# Patient Record
Sex: Female | Born: 1945 | Race: White | Hispanic: No | Smoking: Never smoker
Health system: Southern US, Community
[De-identification: ages and names within clinical notes are randomized; demographics above are authoritative.]

## PROBLEM LIST (undated history)

## (undated) DIAGNOSIS — R011 Cardiac murmur, unspecified: Secondary | ICD-10-CM

## (undated) DIAGNOSIS — I10 Essential (primary) hypertension: Secondary | ICD-10-CM

## (undated) DIAGNOSIS — E119 Type 2 diabetes mellitus without complications: Secondary | ICD-10-CM

## (undated) DIAGNOSIS — B019 Varicella without complication: Secondary | ICD-10-CM

## (undated) DIAGNOSIS — M1990 Unspecified osteoarthritis, unspecified site: Secondary | ICD-10-CM

## (undated) DIAGNOSIS — E7800 Pure hypercholesterolemia, unspecified: Secondary | ICD-10-CM

## (undated) DIAGNOSIS — H547 Unspecified visual loss: Secondary | ICD-10-CM

## (undated) DIAGNOSIS — H409 Unspecified glaucoma: Secondary | ICD-10-CM

## (undated) DIAGNOSIS — K635 Polyp of colon: Secondary | ICD-10-CM

## (undated) HISTORY — DX: Polyp of colon: K63.5

## (undated) HISTORY — DX: Varicella without complication: B01.9

## (undated) HISTORY — DX: Unspecified glaucoma: H40.9

## (undated) HISTORY — PX: CATARACT EXTRACTION: SUR2

## (undated) HISTORY — DX: Unspecified osteoarthritis, unspecified site: M19.90

## (undated) HISTORY — DX: Cardiac murmur, unspecified: R01.1

---
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
## 2014-08-16 DIAGNOSIS — H44512 Absolute glaucoma, left eye: Secondary | ICD-10-CM | POA: Insufficient documentation

## 2015-03-31 ENCOUNTER — Emergency Department (HOSPITAL_BASED_OUTPATIENT_CLINIC_OR_DEPARTMENT_OTHER): Payer: Medicare Other

## 2015-03-31 ENCOUNTER — Emergency Department (HOSPITAL_BASED_OUTPATIENT_CLINIC_OR_DEPARTMENT_OTHER)
Admission: EM | Admit: 2015-03-31 | Discharge: 2015-04-01 | Disposition: A | Discharge status: Home / Self Care | Payer: Medicare Other | Attending: Emergency Medicine | Admitting: Emergency Medicine

## 2015-03-31 ENCOUNTER — Encounter (HOSPITAL_BASED_OUTPATIENT_CLINIC_OR_DEPARTMENT_OTHER): Payer: Self-pay | Admitting: *Deleted

## 2015-03-31 DIAGNOSIS — E119 Type 2 diabetes mellitus without complications: Secondary | ICD-10-CM | POA: Insufficient documentation

## 2015-03-31 DIAGNOSIS — E780 Pure hypercholesterolemia: Secondary | ICD-10-CM | POA: Diagnosis not present

## 2015-03-31 DIAGNOSIS — I10 Essential (primary) hypertension: Secondary | ICD-10-CM | POA: Diagnosis not present

## 2015-03-31 DIAGNOSIS — R51 Headache: Secondary | ICD-10-CM | POA: Diagnosis not present

## 2015-03-31 DIAGNOSIS — R519 Headache, unspecified: Secondary | ICD-10-CM

## 2015-03-31 DIAGNOSIS — Z79899 Other long term (current) drug therapy: Secondary | ICD-10-CM | POA: Diagnosis not present

## 2015-03-31 DIAGNOSIS — Z7982 Long term (current) use of aspirin: Secondary | ICD-10-CM | POA: Insufficient documentation

## 2015-03-31 HISTORY — DX: Pure hypercholesterolemia, unspecified: E78.00

## 2015-03-31 HISTORY — DX: Essential (primary) hypertension: I10

## 2015-03-31 HISTORY — DX: Type 2 diabetes mellitus without complications: E11.9

## 2015-03-31 LAB — CBC WITH DIFFERENTIAL/PLATELET
Basophils Absolute: 0 10*3/uL (ref 0.0–0.1)
Basophils Relative: 0 % (ref 0–1)
Eosinophils Absolute: 0.2 10*3/uL (ref 0.0–0.7)
Eosinophils Relative: 3 % (ref 0–5)
HCT: 42.1 % (ref 36.0–46.0)
Hemoglobin: 13.7 g/dL (ref 12.0–15.0)
Lymphocytes Relative: 25 % (ref 12–46)
Lymphs Abs: 2 10*3/uL (ref 0.7–4.0)
MCH: 28.1 pg (ref 26.0–34.0)
MCHC: 32.5 g/dL (ref 30.0–36.0)
MCV: 86.3 fL (ref 78.0–100.0)
Monocytes Absolute: 1 10*3/uL (ref 0.1–1.0)
Monocytes Relative: 12 % (ref 3–12)
Neutro Abs: 4.8 10*3/uL (ref 1.7–7.7)
Neutrophils Relative %: 60 % (ref 43–77)
Platelets: 254 10*3/uL (ref 150–400)
RBC: 4.88 MIL/uL (ref 3.87–5.11)
RDW: 14.6 % (ref 11.5–15.5)
WBC: 8.1 10*3/uL (ref 4.0–10.5)

## 2015-03-31 LAB — BASIC METABOLIC PANEL
Anion gap: 12 (ref 5–15)
BUN: 15 mg/dL (ref 6–20)
CO2: 29 mmol/L (ref 22–32)
Calcium: 10.1 mg/dL (ref 8.9–10.3)
Chloride: 99 mmol/L — ABNORMAL LOW (ref 101–111)
Creatinine, Ser: 0.81 mg/dL (ref 0.44–1.00)
GFR calc Af Amer: >60 mL/min (ref >60)
GFR calc non Af Amer: >60 mL/min (ref >60)
Glucose, Bld: 112 mg/dL — ABNORMAL HIGH (ref 65–99)
Potassium: 3.6 mmol/L (ref 3.5–5.1)
Sodium: 140 mmol/L (ref 135–145)

## 2015-03-31 MED ORDER — KETOROLAC TROMETHAMINE 15 MG/ML IJ SOLN
15.0000 mg | Freq: Once | INTRAMUSCULAR | Status: AC
  Administered 2015-03-31: 15 mg via INTRAVENOUS
  Filled 2015-03-31: qty 1

## 2015-03-31 NOTE — ED Notes (Signed)
PA at bedside.

## 2015-03-31 NOTE — ED Notes (Signed)
Patient transported to CT via stretcher per tech. 

## 2015-03-31 NOTE — ED Notes (Signed)
Pt. Reports she has had a headache since yesterday on the crown of her head and is reporting she has been dizzy.  Pt. States she has had low and high B/P on her monitor at her home.

## 2015-04-01 NOTE — Discharge Instructions (Signed)
Please monitor for new or worsening signs or symptoms, any present please return immediately for further evaluation. Please contact her primary care provider and schedule immediate follow-up evaluation. Please use Tylenol as needed for headache.

## 2015-04-01 NOTE — ED Provider Notes (Signed)
CSN: [id number]     Arrival date & time 03/31/15  2211 History   First MD Initiated Contact with Patient 03/31/15 2233     Chief Complaint  Patient presents with  . Headache   HPI  69 year old female presents today with a headache. Patient reports for the last 2 days she's had a headache on the top of her head, no radiation of symptoms. Patient denies any significant past medical history of headaches, she denies any focal neurological deficits, fever, no photophobia, phonophobia, head trauma, or any other red flags. Patient notes that recently she's been more unsteady on her feet, normally walks with the walker and the assistance of her son, but has had an increase over this baseline. Patient reports that she has hypertension and had an elevated reading today in the 140s, she also had a reading in the 110s that concerned her. She has she takes her blood pressure medication every day, her diabetes as well managed.    Past Medical History  Diagnosis Date  . Hypertension   . Diabetes mellitus without complication   . Hypercholesterolemia    Past Surgical History  Procedure Laterality Date  . Cesarean section     No family history on file. Social History  Substance Use Topics  . Smoking status: Never Smoker   . Smokeless tobacco: None  . Alcohol Use: No   OB History    No data available     Review of Systems  All other systems reviewed and are negative.   Allergies  Review of patient's allergies indicates no known allergies.  Home Medications   Prior to Admission medications   Medication Sig Start Date End Date Taking? Authorizing Provider  amLODipine (NORVASC) 10 MG tablet Take 10 mg by mouth daily.   Yes Historical Provider, MD  aspirin 81 MG chewable tablet Chew by mouth daily.   Yes Historical Provider, MD  atorvastatin (LIPITOR) 10 MG tablet Take 10 mg by mouth daily.   Yes Historical Provider, MD  hydrochlorothiazide (HYDRODIURIL) 25 MG tablet Take 25 mg by mouth daily.    Yes Historical Provider, MD  metoprolol (TOPROL-XL) 200 MG 24 hr tablet Take 200 mg by mouth daily.   Yes Historical Provider, MD  pioglitazone (ACTOS) 30 MG tablet Take 30 mg by mouth daily.   Yes Historical Provider, MD  potassium chloride (KLOR-CON) 20 MEQ packet Take by mouth 2 (two) times daily.   Yes Historical Provider, MD  sitaGLIPtin-metformin (JANUMET) 50-1000 MG per tablet Take 1 tablet by mouth 2 (two) times daily with a meal.   Yes Historical Provider, MD    BP 152/82 mmHg  Pulse 65  Temp(Src) 98.1 F (36.7 C) (Oral)  Resp 18  Ht 1' (0.305 m)  Wt 159 lb (72.122 kg)  BMI 775.30 kg/m2  SpO2 97%   Physical Exam  Constitutional: She is oriented to person, place, and time. She appears well-developed and well-nourished.  HENT:  Head: Normocephalic and atraumatic.  Eyes: Conjunctivae are normal. Pupils are equal, round, and reactive to light. Right eye exhibits no discharge. Left eye exhibits no discharge. No scleral icterus.  Neck: Normal range of motion. No JVD present. No tracheal deviation present.  Cardiovascular: Normal rate, regular rhythm, normal heart sounds and intact distal pulses.  Exam reveals no gallop and no friction rub.   No murmur heard. Pulmonary/Chest: Effort normal. No stridor.  Abdominal: Soft. She exhibits no mass. There is no tenderness. There is no rebound and no guarding.  Musculoskeletal:  Normal range of motion. She exhibits no edema or tenderness.  Neurological: She is alert and oriented to person, place, and time. She has normal strength. No cranial nerve deficit or sensory deficit. Coordination normal. GCS eye subscore is 4. GCS verbal subscore is 5. GCS motor subscore is 6.  Skin: Skin is warm and dry.  Psychiatric: She has a normal mood and affect. Her behavior is normal. Judgment and thought content normal.  Nursing note and vitals reviewed.   ED Course  Procedures (including critical care time) Labs Review Labs Reviewed  BASIC METABOLIC  PANEL - Abnormal; Notable for the following:    Chloride 99 (*)    Glucose, Bld 112 (*)    All other components within normal limits  CBC WITH DIFFERENTIAL/PLATELET    Imaging Review Ct Head Wo Contrast  03/31/2015   CLINICAL DATA:  Headache, dizziness, unsteady gait  EXAM: CT HEAD WITHOUT CONTRAST  TECHNIQUE: Contiguous axial images were obtained from the base of the skull through the vertex without intravenous contrast.  COMPARISON:  08/30/2013  FINDINGS: Mild cortical volume loss noted with proportional ventricular prominence. Areas of periventricular white matter hypodensity are most compatible with small vessel ischemic change. No acute hemorrhage, infarct, or mass lesion is identified.  IMPRESSION: Chronic findings as above, no acute intracranial abnormality.   Electronically Signed   By: Christiana Pellant M.D.   On: 03/31/2015 23:45   I have personally reviewed and evaluated these images and lab results as part of my medical decision-making.   EKG Interpretation None      MDM   Final diagnoses:  Nonintractable headache, unspecified chronicity pattern, unspecified headache type    Labs: CBC, BMP- no significant findings  Imaging: CT head without contrast- no significant findings  Consults:  Therapeutics: Toradol  Discharge Meds:   Assessment/Plan: Patient presents with a headache, no significant past medical history of headaches. She reports some "unsteadiness" of her gait. Speaking with her son he notes she is always unsteady but admits it may be more pronounced. She has history of hypertension diabetes, CT scan showed no significant findings. Neurological exam normal. Patient has close follow-up with her primary care, she'll be encouraged to call him tomorrow morning schedule follow-up evaluation. She's given strict return precautions, verbalized understanding and agreement with a splint with no further questions or concerns at the time of discharge.        Eyvonne Mechanic, PA-C 04/01/15 1191  Pricilla Loveless, MD 04/05/15 [phone]

## 2016-04-04 IMAGING — CT CT HEAD W/O CM
1 series · 16 of 30 positions shown, 20 images · non-contrast
Comparison: 08/30/2013

CLINICAL DATA: Headache, dizziness, unsteady gait

EXAM:
CT HEAD WITHOUT CONTRAST
TECHNIQUE: Contiguous axial images were obtained from the base of the skull
through the vertex without intravenous contrast.

[Series 2: head 4.8 h37s · axial · 0.40mm/px · z∈[+1141,+1277]mm · 16 of 32 slices shown, 20 images]
[im 2/32  brain]
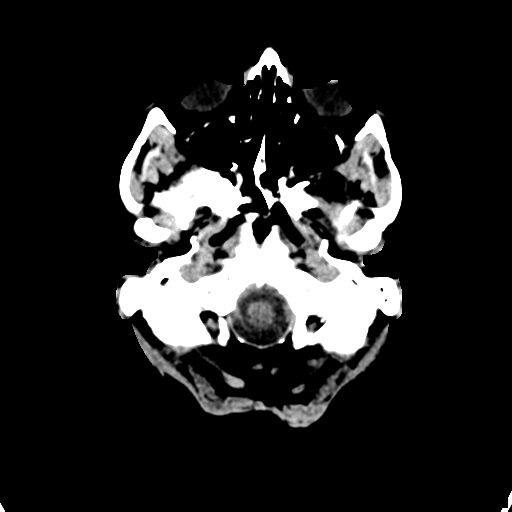
[im 2/32  bone]
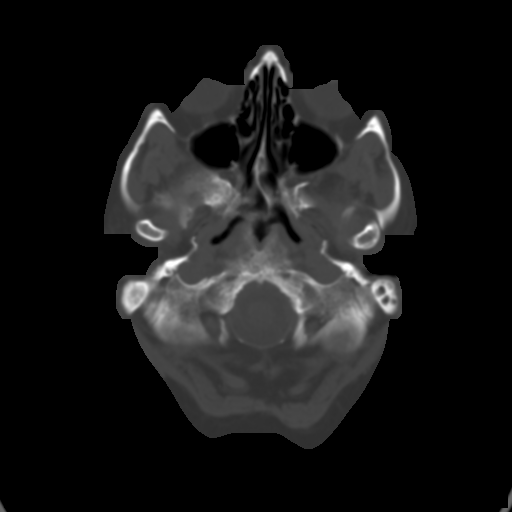
[im 4/32  brain]
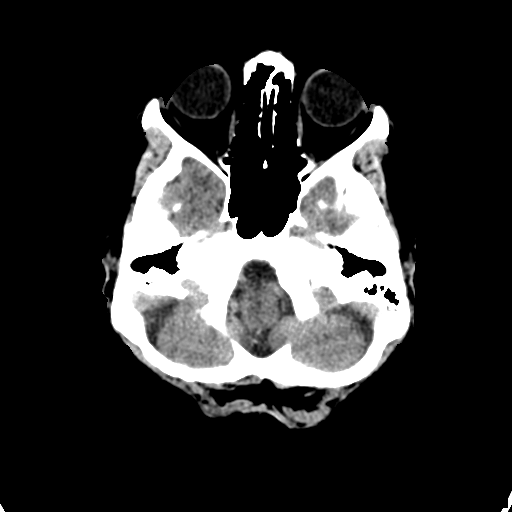
[im 6/32  brain]
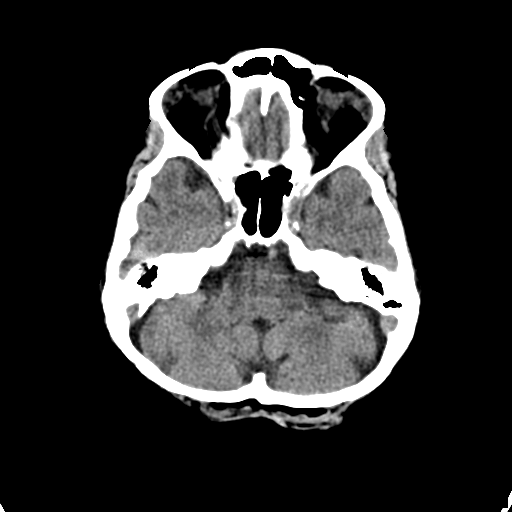
[im 8/32  brain]
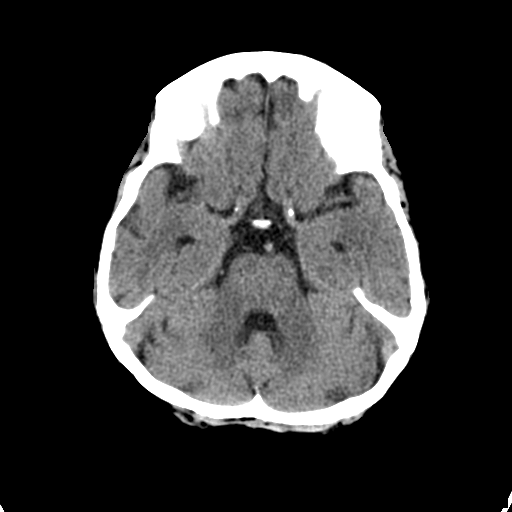
[im 9/32  brain]
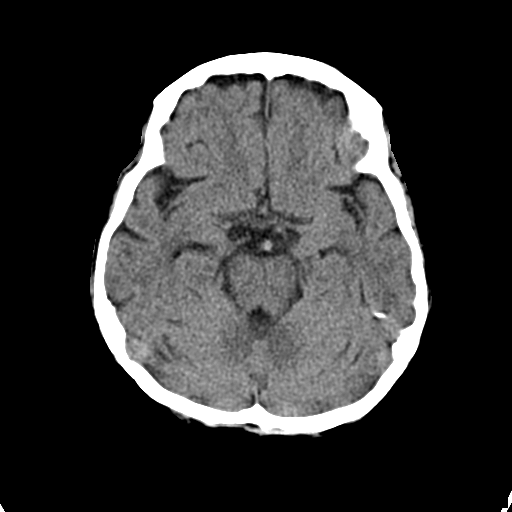
[im 9/32  bone]
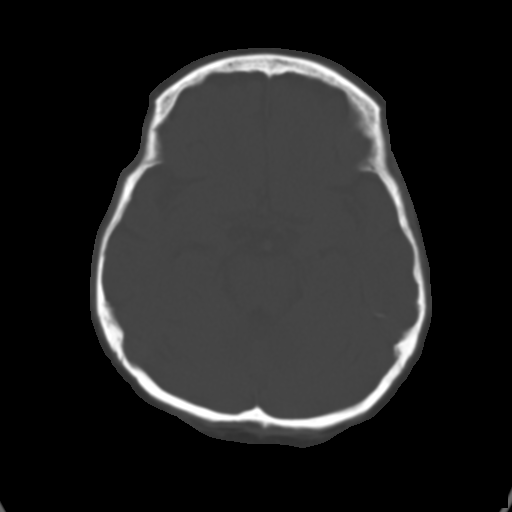
[im 11/32  brain]
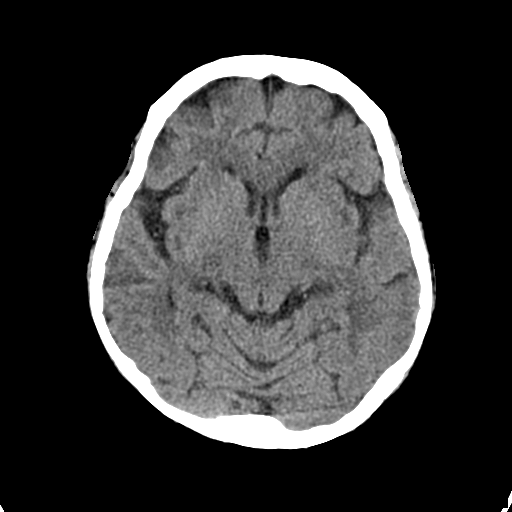
[im 13/32  brain]
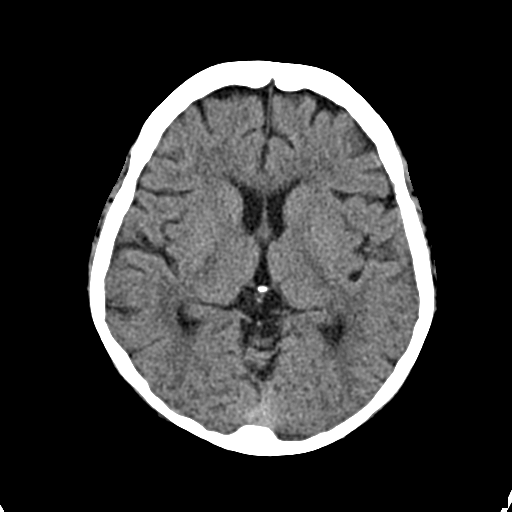
[im 15/32  brain]
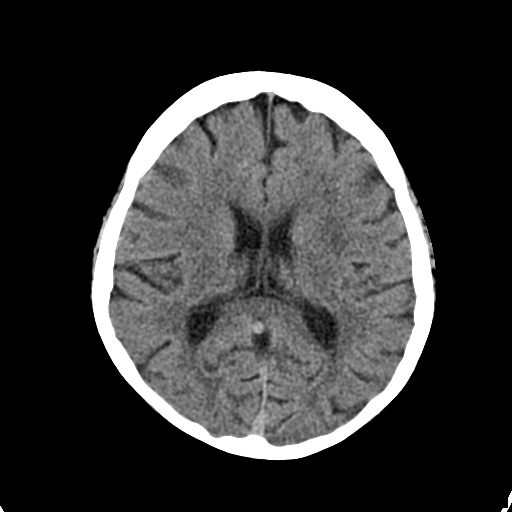
[im 17/32  brain]
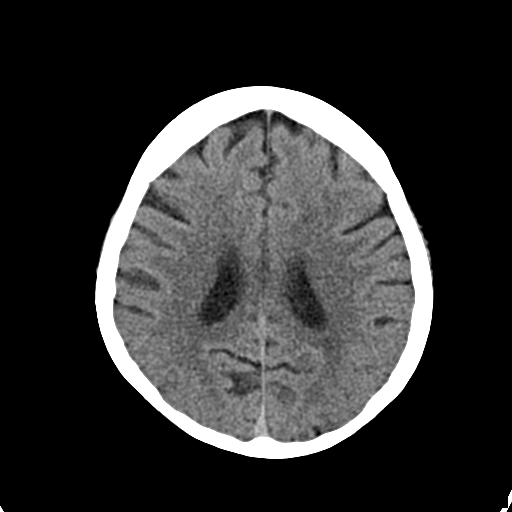
[im 17/32  bone]
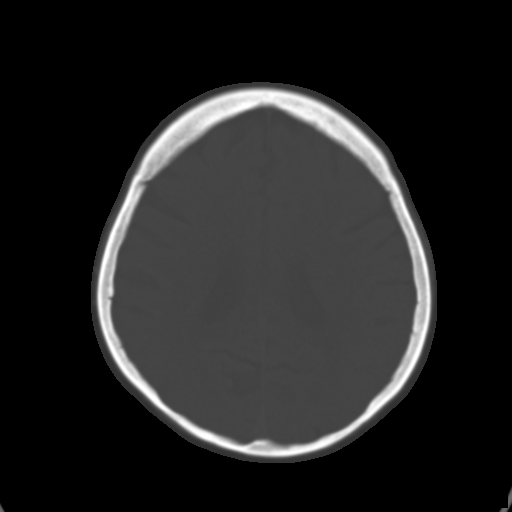
[im 19/32  brain]
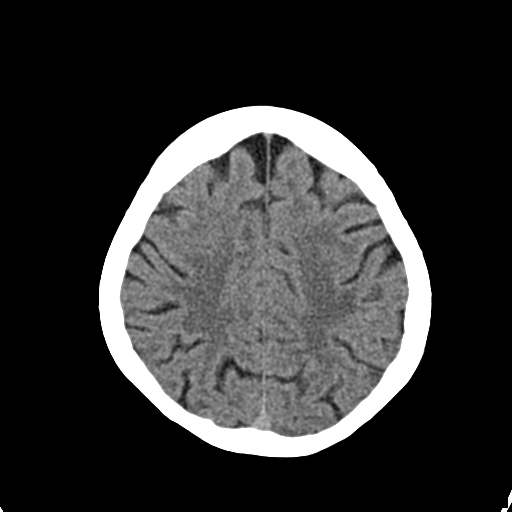
[im 21/32  brain]
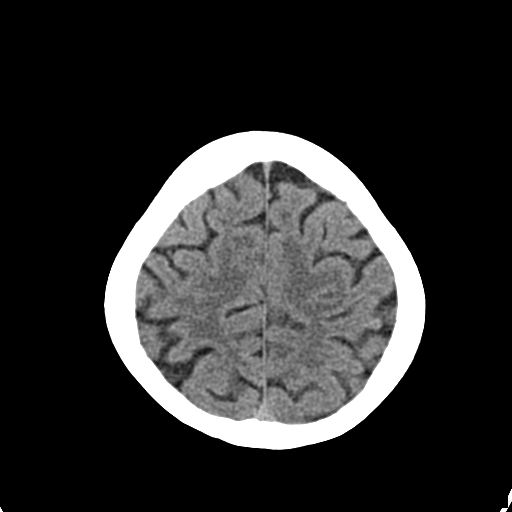
[im 23/32  brain]
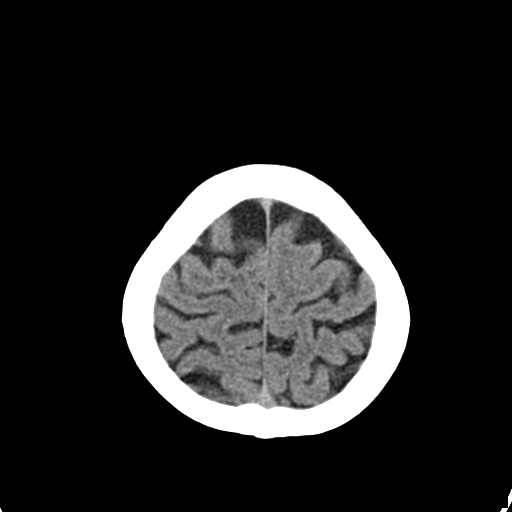
[im 24/32  brain]
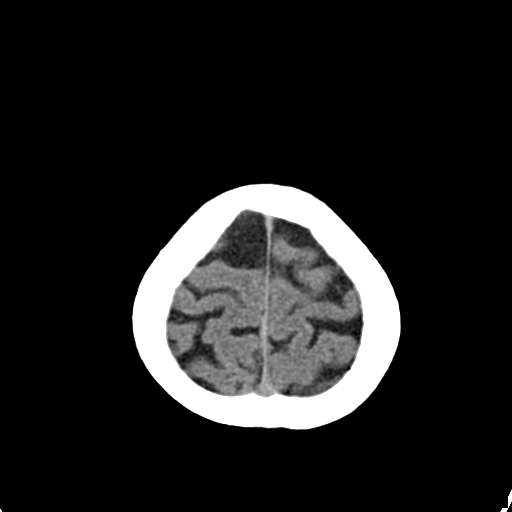
[im 24/32  bone]
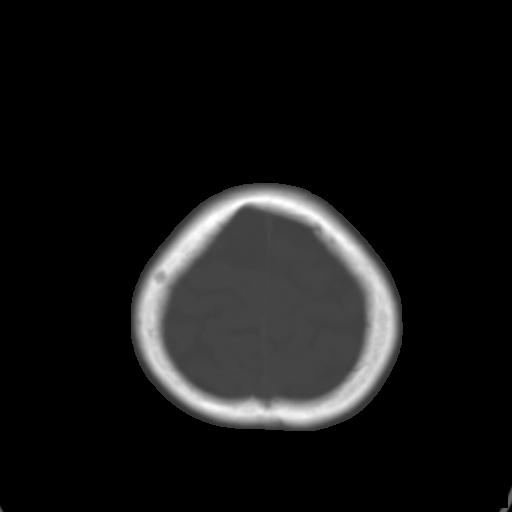
[im 26/32  brain]
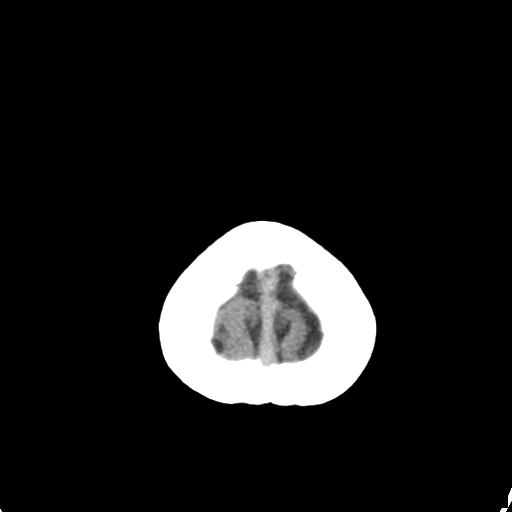
[im 28/32  brain]
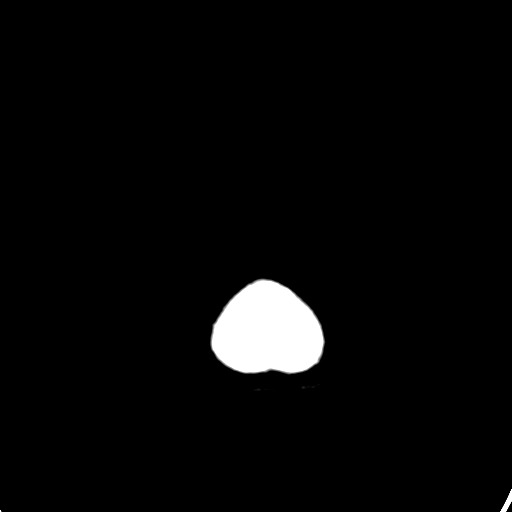
[im 30/32  brain]
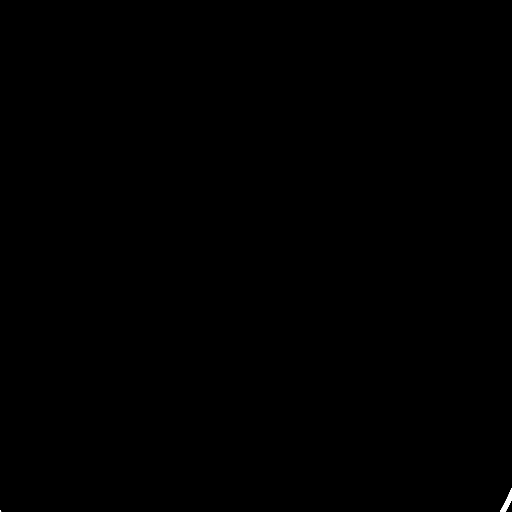

[16 of 30 positions shown; findings below may reference images not displayed]

FINDINGS: Mild cortical volume loss noted with proportional ventricular
prominence. Areas of periventricular white matter hypodensity are
most compatible with small vessel ischemic change. No acute
hemorrhage, infarct, or mass lesion is identified.
IMPRESSION: Chronic findings as above, no acute intracranial abnormality.

## 2019-02-27 DIAGNOSIS — H401113 Primary open-angle glaucoma, right eye, severe stage: Secondary | ICD-10-CM | POA: Insufficient documentation

## 2019-10-23 DIAGNOSIS — E1165 Type 2 diabetes mellitus with hyperglycemia: Secondary | ICD-10-CM | POA: Insufficient documentation

## 2019-10-23 DIAGNOSIS — E11319 Type 2 diabetes mellitus with unspecified diabetic retinopathy without macular edema: Secondary | ICD-10-CM | POA: Insufficient documentation

## 2019-10-23 DIAGNOSIS — M810 Age-related osteoporosis without current pathological fracture: Secondary | ICD-10-CM | POA: Insufficient documentation

## 2020-03-01 LAB — HM DEXA SCAN

## 2020-08-02 HISTORY — PX: COLONOSCOPY: SHX174

## 2021-12-02 DIAGNOSIS — M2041 Other hammer toe(s) (acquired), right foot: Secondary | ICD-10-CM | POA: Insufficient documentation

## 2021-12-02 DIAGNOSIS — L600 Ingrowing nail: Secondary | ICD-10-CM | POA: Insufficient documentation

## 2021-12-02 DIAGNOSIS — M21611 Bunion of right foot: Secondary | ICD-10-CM | POA: Insufficient documentation

## 2021-12-02 HISTORY — DX: Ingrowing nail: L60.0

## 2022-08-12 ENCOUNTER — Other Ambulatory Visit: Payer: Self-pay

## 2022-08-12 ENCOUNTER — Ambulatory Visit
Admission: EM | Admit: 2022-08-12 | Discharge: 2022-08-12 | Disposition: A | Discharge status: Home / Self Care | Payer: Medicare Other | Attending: Urgent Care | Admitting: Urgent Care

## 2022-08-12 DIAGNOSIS — E119 Type 2 diabetes mellitus without complications: Secondary | ICD-10-CM

## 2022-08-12 DIAGNOSIS — U071 COVID-19: Secondary | ICD-10-CM | POA: Diagnosis not present

## 2022-08-12 HISTORY — DX: Unspecified visual loss: H54.7

## 2022-08-12 MED ORDER — PAXLOVID (300/100) 20 X 150 MG & 10 X 100MG PO TBPK: ORAL_TABLET | ORAL | 0 refills | Status: DC

## 2022-08-12 MED ORDER — PROMETHAZINE-DM 6.25-15 MG/5ML PO SYRP: 2.5000 mL | ORAL_SOLUTION | Freq: Three times a day (TID) | ORAL | 0 refills | Status: DC | PRN

## 2022-08-12 MED ORDER — BENZONATATE 100 MG PO CAPS: 100.0000 mg | ORAL_CAPSULE | Freq: Three times a day (TID) | ORAL | 0 refills | Status: DC | PRN

## 2022-08-12 NOTE — ED Triage Notes (Signed)
Pt c/o cough, URI sx x 2 days with +covid exposure in the home-NAD-steady gait with own cane

## 2022-08-12 NOTE — ED Provider Notes (Signed)
[facility]  Note:  This document was prepared using Systems analyst and may include unintentional dictation errors.  MRN: [id number] DOB: [birth year]  Subjective:   Sara Moreno is a 77 y.o. female presenting for 2-day history of acute onset sinus congestion, postnasal drainage, coughing. Had COVID exposure at home.  No chest pain, shortness of breath or wheezing.  Patient is a type II diabetic treated without insulin.  Last GFR from 08/05/2020 was 31mL/min.  Does not want blood work done to confirm her latest GFR.  No current facility-administered medications for this encounter.  Current Outpatient Medications:    amLODipine (NORVASC) 10 MG tablet, Take 10 mg by mouth daily., Disp: , Rfl:    aspirin 81 MG chewable tablet, Chew by mouth daily., Disp: , Rfl:    atorvastatin (LIPITOR) 10 MG tablet, Take 10 mg by mouth daily., Disp: , Rfl:    hydrochlorothiazide (HYDRODIURIL) 25 MG tablet, Take 25 mg by mouth daily., Disp: , Rfl:    metoprolol (TOPROL-XL) 200 MG 24 hr tablet, Take 200 mg by mouth daily., Disp: , Rfl:    pioglitazone (ACTOS) 30 MG tablet, Take 30 mg by mouth daily., Disp: , Rfl:    potassium chloride (KLOR-CON) 20 MEQ packet, Take by mouth 2 (two) times daily., Disp: , Rfl:    sitaGLIPtin-metformin (JANUMET) 50-1000 MG per tablet, Take 1 tablet by mouth 2 (two) times daily with a meal., Disp: , Rfl:    No Known Allergies  Past Medical History:  Diagnosis Date   Diabetes mellitus without complication ([place])    Hypercholesterolemia    Hypertension      Past Surgical History:  Procedure Laterality Date   CESAREAN SECTION      No family history on file.  Social History   Tobacco Use   Smoking status: Never  Substance Use Topics   Alcohol use: No    ROS   Objective:   Vitals: BP 135/86 (BP Location: Right Arm)   Pulse 80   Temp 99.7 F (37.6 C) (Oral)   Resp 18   SpO2 93%   Physical Exam Constitutional:       General: She is not in acute distress.    Appearance: Normal appearance. She is well-developed and normal weight. She is not ill-appearing, toxic-appearing or diaphoretic.  HENT:     Head: Normocephalic and atraumatic.     Right Ear: Tympanic membrane, ear canal and external ear normal. No drainage or tenderness. No middle ear effusion. There is no impacted cerumen. Tympanic membrane is not erythematous or bulging.     Left Ear: Tympanic membrane, ear canal and external ear normal. No drainage or tenderness.  No middle ear effusion. There is no impacted cerumen. Tympanic membrane is not erythematous or bulging.     Nose: Nose normal. No congestion or rhinorrhea.     Mouth/Throat:     Mouth: Mucous membranes are moist. No oral lesions.     Pharynx: No pharyngeal swelling, oropharyngeal exudate, posterior oropharyngeal erythema or uvula swelling.     Tonsils: No tonsillar exudate or tonsillar abscesses.  Eyes:     General: No scleral icterus.       Right eye: No discharge.        Left eye: No discharge.     Extraocular Movements: Extraocular movements intact.     Right eye: Normal extraocular motion.     Left eye: Normal extraocular motion.     Conjunctiva/sclera: Conjunctivae normal.  Cardiovascular:     Rate and Rhythm: Normal rate and regular rhythm.     Heart sounds: Normal heart sounds. No murmur heard.    No friction rub. No gallop.  Pulmonary:     Effort: Pulmonary effort is normal. No respiratory distress.     Breath sounds: No stridor. No wheezing, rhonchi or rales.  Chest:     Chest wall: No tenderness.  Musculoskeletal:     Cervical back: Normal range of motion and neck supple.  Lymphadenopathy:     Cervical: No cervical adenopathy.  Skin:    General: Skin is warm and dry.  Neurological:     General: No focal deficit present.     Mental Status: She is alert and oriented to person, place, and time.  Psychiatric:        Mood and Affect: Mood normal.        Behavior:  Behavior normal.     Assessment and Plan :   PDMP not reviewed this encounter.  1. Clinical diagnosis of COVID-19   2. Type 2 diabetes mellitus treated without insulin ([place])     Will treat for COVID-19 given her close exposure, symptoms especially as patient has risk factors including her diabetes and age.  Start Paxlovid. Deferred imaging given clear cardiopulmonary exam, hemodynamically stable vital signs.  Recommend supportive care. Counseled patient on potential for adverse effects with medications prescribed/recommended today, ER and return-to-clinic precautions discussed, patient verbalized understanding.    Jaynee Eagles, PA-C 08/12/22 1321

## 2022-08-12 NOTE — Discharge Instructions (Addendum)
We will manage this COVID infection by having you start Paxlovid. For sore throat or cough try using a honey-based tea. Use 3 teaspoons of honey with juice squeezed from half lemon. Place shaved pieces of ginger into 1/2-1 cup of water and warm over stove top. Then mix the ingredients and repeat every 4 hours as needed. Please take Tylenol 500mg -650mg  every 6 hours for throat pain, fevers, aches and pains. Hydrate very well with at least 2 liters of water. Eat light meals such as soups (chicken and noodles, vegetable, chicken and wild rice).  Do not eat foods that you are allergic to.  Taking an antihistamine like Zyrtec can help against postnasal drainage, sinus congestion which can cause sinus pain, sinus headaches, throat pain, painful swallowing, coughing.  You can take this together with pseudoephedrine (Sudafed) at a dose of 30 mg 3 times a day or twice daily as needed for the same kind of nasal drip, congestion.  Use cough medications as needed.  Do not take atorvastatin, your cholesterol medication, while taking Paxlovid.

## 2022-08-13 LAB — SARS CORONAVIRUS 2 (TAT 6-24 HRS): SARS Coronavirus 2: POSITIVE — AB

## 2022-09-08 ENCOUNTER — Encounter: Payer: Self-pay | Admitting: Family Medicine

## 2022-09-08 ENCOUNTER — Ambulatory Visit (INDEPENDENT_AMBULATORY_CARE_PROVIDER_SITE_OTHER): Payer: Medicare Other | Admitting: Family Medicine

## 2022-09-08 VITALS — BP 123/78 | HR 68 | Temp 98.5°F | Ht 59.0 in | Wt 131.4 lb

## 2022-09-08 DIAGNOSIS — E1169 Type 2 diabetes mellitus with other specified complication: Secondary | ICD-10-CM | POA: Insufficient documentation

## 2022-09-08 DIAGNOSIS — M1990 Unspecified osteoarthritis, unspecified site: Secondary | ICD-10-CM | POA: Insufficient documentation

## 2022-09-08 DIAGNOSIS — M810 Age-related osteoporosis without current pathological fracture: Secondary | ICD-10-CM

## 2022-09-08 DIAGNOSIS — Z7689 Persons encountering health services in other specified circumstances: Secondary | ICD-10-CM

## 2022-09-08 DIAGNOSIS — E7800 Pure hypercholesterolemia, unspecified: Secondary | ICD-10-CM | POA: Diagnosis not present

## 2022-09-08 DIAGNOSIS — E538 Deficiency of other specified B group vitamins: Secondary | ICD-10-CM | POA: Insufficient documentation

## 2022-09-08 DIAGNOSIS — I10 Essential (primary) hypertension: Secondary | ICD-10-CM | POA: Insufficient documentation

## 2022-09-08 DIAGNOSIS — E11311 Type 2 diabetes mellitus with unspecified diabetic retinopathy with macular edema: Secondary | ICD-10-CM

## 2022-09-08 LAB — COMPREHENSIVE METABOLIC PANEL
ALT: 10 U/L (ref 0–35)
AST: 12 U/L (ref 0–37)
Albumin: 4.6 g/dL (ref 3.5–5.2)
Alkaline Phosphatase: 56 U/L (ref 39–117)
BUN: 16 mg/dL (ref 6–23)
CO2: 33 mEq/L — ABNORMAL HIGH (ref 19–32)
Calcium: 10.7 mg/dL — ABNORMAL HIGH (ref 8.4–10.5)
Chloride: 94 mEq/L — ABNORMAL LOW (ref 96–112)
Creatinine, Ser: 0.99 mg/dL (ref 0.40–1.20)
GFR: 54.62 mL/min — ABNORMAL LOW (ref >60.00)
Glucose, Bld: 83 mg/dL (ref 70–99)
Potassium: 3.6 mEq/L (ref 3.5–5.1)
Sodium: 142 mEq/L (ref 135–145)
Total Bilirubin: 0.7 mg/dL (ref 0.2–1.2)
Total Protein: 7.3 g/dL (ref 6.0–8.3)

## 2022-09-08 LAB — CBC
HCT: 42.7 % (ref 36.0–46.0)
Hemoglobin: 14.4 g/dL (ref 12.0–15.0)
MCHC: 33.8 g/dL (ref 30.0–36.0)
MCV: 87.7 fl (ref 78.0–100.0)
Platelets: 238 10*3/uL (ref 150.0–400.0)
RBC: 4.87 Mil/uL (ref 3.87–5.11)
RDW: 14.8 % (ref 11.5–15.5)
WBC: 8.1 10*3/uL (ref 4.0–10.5)

## 2022-09-08 LAB — LIPID PANEL
Cholesterol: 188 mg/dL (ref 0–200)
HDL: 58.5 mg/dL (ref >39.00)
LDL Cholesterol: 92 mg/dL (ref 0–99)
NonHDL: 129.45
Total CHOL/HDL Ratio: 3
Triglycerides: 188 mg/dL — ABNORMAL HIGH (ref 0.0–149.0)
VLDL: 37.6 mg/dL (ref 0.0–40.0)

## 2022-09-08 LAB — MICROALBUMIN / CREATININE URINE RATIO
Creatinine,U: 142.5 mg/dL
Microalb Creat Ratio: 1.6 mg/g (ref 0.0–30.0)
Microalb, Ur: 2.3 mg/dL — ABNORMAL HIGH (ref 0.0–1.9)

## 2022-09-08 LAB — TSH: TSH: 1.58 u[IU]/mL (ref 0.35–5.50)

## 2022-09-08 MED ORDER — AMLODIPINE BESYLATE 10 MG PO TABS: 10.0000 mg | ORAL_TABLET | Freq: Every day | ORAL | 1 refills | Status: DC

## 2022-09-08 MED ORDER — ALENDRONATE SODIUM 70 MG PO TABS: 70.0000 mg | ORAL_TABLET | ORAL | 11 refills | Status: DC

## 2022-09-08 MED ORDER — METFORMIN HCL 500 MG PO TABS: 500.0000 mg | ORAL_TABLET | Freq: Two times a day (BID) | ORAL | 1 refills | Status: DC

## 2022-09-08 MED ORDER — HYDROCHLOROTHIAZIDE 25 MG PO TABS: 25.0000 mg | ORAL_TABLET | Freq: Every day | ORAL | 1 refills | Status: DC

## 2022-09-08 MED ORDER — POTASSIUM CHLORIDE CRYS ER 20 MEQ PO TBCR: 20.0000 meq | EXTENDED_RELEASE_TABLET | Freq: Two times a day (BID) | ORAL | 1 refills | Status: DC

## 2022-09-08 MED ORDER — ATORVASTATIN CALCIUM 80 MG PO TABS: 80.0000 mg | ORAL_TABLET | Freq: Every day | ORAL | 1 refills | Status: DC

## 2022-09-08 MED ORDER — METOPROLOL SUCCINATE ER 200 MG PO TB24: 200.0000 mg | ORAL_TABLET | Freq: Every day | ORAL | 1 refills | Status: DC

## 2022-09-08 NOTE — Telephone Encounter (Signed)
See below

## 2022-09-08 NOTE — Patient Instructions (Addendum)
Return in about 15 weeks (around 12/22/2022) for Routine chronic condition follow-up.        Great to see you today.  I have refilled the medication(s) we provide.   If labs were collected, we will inform you of lab results once received either by echart message or telephone call.   - echart message- for normal results that have been seen by the patient already.   - telephone call: abnormal results or if patient has not viewed results in their echart.

## 2022-09-08 NOTE — Progress Notes (Signed)
Patient ID: Sara Moreno, female  DOB: Oct 30, 1945, 77 y.o.   MRN: [id number] Patient Care Team    Relationship Specialty Notifications Start End  Natalia Leatherwood, DO PCP - General Family Medicine  09/08/22   Bond, Doran Stabler, MD Referring Physician Ophthalmology  09/08/22     Chief Complaint  Patient presents with   Establish Care   Diabetes    Subjective:  Sara Moreno is a 77 y.o.  female present for new patient establishment. All past medical history, surgical history, allergies, family history, immunizations, medications and social history were updated in the electronic medical record today. All recent labs, ED visits and hospitalizations within the last year were reviewed.   Type 2 diabetes mellitus with hyperlipidemia (HCC) Pt reports compliance with metformin 500 twice daily and atorvastatin 80 mg nightly.. Denies numbness, tingling of extremities, hypo/hyperglycemic events or non-healing wounds. 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  She would like to try the [facility], in hopes that she can see her sugars better.   Primary hypertension Pt reports compliance with amlodipine 10 mg daily, metoprolol 200 mg daily, HCTZ 25 mg daily, K-Dur 20. Patient denies chest pain, shortness of breath or lower extremity edema. Pt takes a daily baby ASA. Pt is  prescribed statin.  Age-related osteoporosis without current pathological fracture Patient reports she has been prescribed Fosamax 70 mg once weekly for her osteoporosis.  Last DEXA 2021.      09/08/2022    1:27 PM  Depression screen PHQ 2/9  Decreased Interest 0  Down, Depressed, Hopeless 0  PHQ - 2 Score 0  Altered sleeping 0  Tired, decreased energy 0  Change in appetite 0  Feeling bad or failure about yourself  1  Trouble concentrating 0  Moving slowly or fidgety/restless 0  Suicidal thoughts 0  PHQ-9 Score 1  Difficult doing work/chores  Very difficult      09/08/2022    1:27 PM  GAD 7 : Generalized Anxiety Score  Nervous, Anxious, on Edge 1  Control/stop worrying 2  Worry too much - different things 2  Trouble relaxing 0  Restless 0  Easily annoyed or irritable 0  Afraid - awful might happen 0  Total GAD 7 Score 5  Anxiety Difficulty Not difficult at all          09/08/2022    1:26 PM  Fall Risk   Falls in the past year? 0  Number falls in past yr: 0  Injury with Fall? 0  Risk for fall due to : Impaired balance/gait;Impaired vision  Follow up Falls evaluation completed     Immunization History  Administered Date(s) Administered   Fluad Quad(high Dose 65+) 05/30/2020, 05/02/2022   PFIZER(Purple Top)SARS-COV-2 Vaccination 11/08/2019, 11/29/2019, 05/27/2020   PNEUMOCOCCAL CONJUGATE-20 05/04/2022   RSV,unspecified 05/04/2022   Unspecified SARS-COV-2 Vaccination 05/03/2022   Zoster Recombinat (Shingrix) 06/11/2021, 07/13/2021    No results found.  Past Medical History:  Diagnosis Date   Arthritis    Blind    Chicken pox    Colon polyp    Diabetes mellitus without complication (HCC)    Glaucoma    Heart murmur    Hypercholesterolemia    Hypertension    Ingrowing toenail 12/02/2021   No Active Allergies  Past Surgical History:  Procedure Laterality Date   CATARACT EXTRACTION     CESAREAN SECTION  1971  COLONOSCOPY  2022   TONSILLECTOMY  1955   Family History  Problem Relation Age of Onset   Hypertension Mother    Arthritis Mother    Heart attack Mother    Heart disease Mother    Hypertension Father    Arthritis Father    Hypertension Sister    Arthritis Sister    Hearing loss Sister    Hypertension Son    Diabetes Son    Asthma Son    Depression Son    Social History   Social History Narrative   Marital status/children/pets: Widow.  Widow   Education/employment: Retired, high school education   Safety:      -smoke alarm in the home:Yes     - wears seatbelt: Yes     -  Feels safe in their relationships: Yes       Allergies as of 09/08/2022   No Active Allergies      Medication List        Accurate as of September 08, 2022  5:15 PM. If you have any questions, ask your nurse or doctor.          STOP taking these medications    Accu-Chek Aviva Plus test strip Generic drug: glucose blood Stopped by: Howard Pouch, DO   Accu-Chek Aviva Plus w/Device Kit Stopped by: Howard Pouch, DO   Accu-Chek Softclix Lancets lancets Stopped by: Howard Pouch, DO   acetaZOLAMIDE 250 MG tablet Commonly known as: DIAMOX Stopped by: Howard Pouch, DO   benzonatate 100 MG capsule Commonly known as: TESSALON Stopped by: Howard Pouch, DO   brimonidine 0.2 % ophthalmic solution Commonly known as: ALPHAGAN Stopped by: Howard Pouch, DO   dorzolamide-timolol 2-0.5 % ophthalmic solution Commonly known as: COSOPT Stopped by: Howard Pouch, DO   Paxlovid (300/100) 20 x 150 MG & 10 x 100MG  Tbpk Generic drug: nirmatrelvir & ritonavir Stopped by: Howard Pouch, DO   pioglitazone 30 MG tablet Commonly known as: ACTOS Stopped by: Howard Pouch, DO   potassium chloride 20 MEQ packet Commonly known as: KLOR-CON Stopped by: Howard Pouch, DO   promethazine-dextromethorphan 6.25-15 MG/5ML syrup Commonly known as: PROMETHAZINE-DM Stopped by: Howard Pouch, DO   sitaGLIPtin-metformin 50-1000 MG tablet Commonly known as: JANUMET Stopped by: Howard Pouch, DO       TAKE these medications    alendronate 70 MG tablet Commonly known as: FOSAMAX Take 1 tablet (70 mg total) by mouth once a week. Empty stomach with a full glass of water What changed:  how much to take when to take this additional instructions Changed by: Howard Pouch, DO   amLODipine 10 MG tablet Commonly known as: NORVASC Take 1 tablet (10 mg total) by mouth daily. What changed: Another medication with the same name was removed. Continue taking this medication, and follow the directions you see  here. Changed by: Howard Pouch, DO   aspirin 81 MG chewable tablet Chew by mouth daily. What changed: Another medication with the same name was removed. Continue taking this medication, and follow the directions you see here. Changed by: Howard Pouch, DO   atorvastatin 80 MG tablet Commonly known as: LIPITOR Take 1 tablet (80 mg total) by mouth daily. What changed:  how much to take when to take this Another medication with the same name was removed. Continue taking this medication, and follow the directions you see here. Changed by: Howard Pouch, DO   hydrochlorothiazide 25 MG tablet Commonly known as: HYDRODIURIL Take 1 tablet (25 mg total) by  mouth daily. What changed: Another medication with the same name was removed. Continue taking this medication, and follow the directions you see here. Changed by: Howard Pouch, DO   metFORMIN 500 MG tablet Commonly known as: GLUCOPHAGE Take 1 tablet (500 mg total) by mouth 2 (two) times daily with a meal. What changed:  how much to take when to take this Another medication with the same name was removed. Continue taking this medication, and follow the directions you see here. Changed by: Howard Pouch, DO   metoprolol 200 MG 24 hr tablet Commonly known as: TOPROL-XL Take 1 tablet (200 mg total) by mouth daily. What changed: Another medication with the same name was removed. Continue taking this medication, and follow the directions you see here. Changed by: Howard Pouch, DO   potassium chloride SA 20 MEQ tablet Commonly known as: KLOR-CON M Take 1 tablet (20 mEq total) by mouth 2 (two) times daily. Started by: Howard Pouch, DO   prednisoLONE acetate 1 % ophthalmic suspension Commonly known as: PRED FORTE Place 1 drop into the right eye 3 (three) times daily.        All past medical history, surgical history, allergies, family history, immunizations andmedications were updated in the EMR today and reviewed under the history  and medication portions of their EMR.      No results found.   ROS 14 pt review of systems performed and negative (unless mentioned in an HPI)  Objective: BP 123/78   Pulse 68   Temp 98.5 F (36.9 C)   Ht 4\' 11"  (1.499 m)   Wt 131 lb 6.4 oz (59.6 kg)   SpO2 97%   BMI 26.54 kg/m  Physical Exam Vitals and nursing note reviewed.  Constitutional:      General: She is not in acute distress.    Appearance: Normal appearance. She is not ill-appearing, toxic-appearing or diaphoretic.  HENT:     Head: Normocephalic and atraumatic.  Eyes:     General: No scleral icterus.       Right eye: No discharge.        Left eye: No discharge.     Extraocular Movements: Extraocular movements intact.     Conjunctiva/sclera: Conjunctivae normal.     Pupils: Pupils are equal, round, and reactive to light.  Cardiovascular:     Rate and Rhythm: Normal rate and regular rhythm.  Pulmonary:     Effort: Pulmonary effort is normal. No respiratory distress.     Breath sounds: Normal breath sounds. No wheezing, rhonchi or rales.  Musculoskeletal:     Right lower leg: No edema.     Left lower leg: No edema.  Skin:    General: Skin is warm.     Findings: No rash.  Neurological:     Mental Status: She is alert and oriented to person, place, and time. Mental status is at baseline.     Motor: No weakness.     Gait: Gait normal.  Psychiatric:        Mood and Affect: Mood normal.        Behavior: Behavior normal.        Thought Content: Thought content normal.        Judgment: Judgment normal.     Diabetic Foot Exam - Simple   Simple Foot Form Diabetic Foot exam was performed with the following findings: Yes 09/08/2022  2:54 PM  Visual Inspection No deformities, no ulcerations, no other skin breakdown bilaterally: Yes Sensation Testing Intact to  touch and monofilament testing bilaterally: Yes Pulse Check Posterior Tibialis and Dorsalis pulse intact bilaterally: Yes Comments      Assessment/plan: RUBBIE GOOSTREE is a 77 y.o. female present for est care Type 2 diabetes mellitus with hyperlipidemia (HCC) Stable Continue metformin 500 mg twice daily PNA series: completed 2023 Flu shot: UTD 2023 (recommneded yearly) Foot exam: 09/08/2022- completed Eye exam: completed 2024, Dr. Edilia Bo Urine microalbumin collected today A1c: 6.8 today  Primary hypertension Stable Continue amlodipine 10 mg daily Continue metoprolol succinate 200 mg daily Continue HCTZ 25 mg daily Continue K-Dur 20 daily Continue ASA 81 mg Continue atorvastatin 80 mg CBC, CMP, TSH and lipids collected today  Age-related osteoporosis without current pathological fracture Patient reports she has been prescribed Fosamax 70 mg once weekly for her osteoporosis.  Last DEXA 2021.   Return in about 15 weeks (around 12/22/2022) for Routine chronic condition follow-up.  Orders Placed This Encounter  Procedures   CBC   Comp Met (CMET)   TSH   Lipid panel   Urine Microalbumin w/creat. ratio   POCT HgB A1C   Meds ordered this encounter  Medications   alendronate (FOSAMAX) 70 MG tablet    Sig: Take 1 tablet (70 mg total) by mouth once a week. Empty stomach with a full glass of water    Dispense:  4 tablet    Refill:  11   amLODipine (NORVASC) 10 MG tablet    Sig: Take 1 tablet (10 mg total) by mouth daily.    Dispense:  90 tablet    Refill:  1   atorvastatin (LIPITOR) 80 MG tablet    Sig: Take 1 tablet (80 mg total) by mouth daily.    Dispense:  90 tablet    Refill:  1   hydrochlorothiazide (HYDRODIURIL) 25 MG tablet    Sig: Take 1 tablet (25 mg total) by mouth daily.    Dispense:  90 tablet    Refill:  1   metFORMIN (GLUCOPHAGE) 500 MG tablet    Sig: Take 1 tablet (500 mg total) by mouth 2 (two) times daily with a meal.    Dispense:  180 tablet    Refill:  1   metoprolol (TOPROL-XL) 200 MG 24 hr tablet    Sig: Take 1 tablet (200 mg total) by mouth daily.    Dispense:  90 tablet     Refill:  1   potassium chloride SA (KLOR-CON M) 20 MEQ tablet    Sig: Take 1 tablet (20 mEq total) by mouth 2 (two) times daily.    Dispense:  90 tablet    Refill:  1   Referral Orders  No referral(s) requested today   59 minutes dedicated to patient today.  Time spent establishing patient, counseling patient and family, establishing patient by reviewing records and correcting multiple medication discrepancies.  Note is dictated utilizing voice recognition software. Although note has been proof read prior to signing, occasional typographical errors still can be missed. If any questions arise, please do not hesitate to call for verification.  Electronically signed by: Howard Pouch, DO [place]

## 2022-09-09 LAB — POCT GLYCOSYLATED HEMOGLOBIN (HGB A1C)
HbA1c POC (<> result, manual entry): 6.8 % (ref 4.0–5.6)
HbA1c, POC (controlled diabetic range): 6.8 % (ref 0.0–7.0)
HbA1c, POC (prediabetic range): 6.8 % — AB (ref 5.7–6.4)
Hemoglobin A1C: 6.8 % — AB (ref 4.0–5.6)

## 2022-09-09 NOTE — Addendum Note (Signed)
Addended by: Beatrix Fetters on: 09/09/2022 07:52 AM   Modules accepted: Orders

## 2022-09-20 ENCOUNTER — Telehealth: Payer: Self-pay | Admitting: Family Medicine

## 2022-09-20 NOTE — Telephone Encounter (Signed)
Called patient to schedule Medicare Annual Wellness Visit (AWV). Left message for patient to call back and schedule Medicare Annual Wellness Visit (AWV).  Last date of AWV: NO HISTORY  Please schedule an appointment at any time with Otila Kluver, [facility].  If any questions, please contact me at [phone].  Thank you ,   Shaune Pollack [facility] AWV TEAM Direct Dial [phone]

## 2022-09-22 ENCOUNTER — Ambulatory Visit (INDEPENDENT_AMBULATORY_CARE_PROVIDER_SITE_OTHER): Payer: Medicare Other

## 2022-09-22 VITALS — Wt 131.0 lb

## 2022-09-22 DIAGNOSIS — Z0000 Encounter for general adult medical examination without abnormal findings: Secondary | ICD-10-CM

## 2022-09-22 NOTE — Progress Notes (Signed)
I connected with  Sara Moreno on 09/22/22 by a audio enabled telemedicine application and verified that I am speaking with the correct person using two identifiers.  Patient Location: Home  Provider Location: Home Office  I discussed the limitations of evaluation and management by telemedicine. The patient expressed understanding and agreed to proceed.   Subjective:   Sara Moreno is a 77 y.o. female who presents for an Initial Medicare Annual Wellness Visit.  Review of Systems     Cardiac Risk Factors include: advanced age (>79mn, >>31women);hypertension;dyslipidemia;diabetes mellitus;sedentary lifestyle     Objective:    Today's Vitals   09/22/22 1119  Weight: 131 lb (59.4 kg)   Body mass index is 26.46 kg/m.     09/22/2022   11:25 AM 03/31/2015   10:27 PM  Advanced Directives  Does Patient Have a Medical Advance Directive? No No  Would patient like information on creating a medical advance directive? No - Patient declined No - patient declined information    Current Medications (verified) Outpatient Encounter Medications as of 09/22/2022  Medication Sig   alendronate (FOSAMAX) 70 MG tablet Take 1 tablet (70 mg total) by mouth once a week. Empty stomach with a full glass of water   amLODipine (NORVASC) 10 MG tablet Take 1 tablet (10 mg total) by mouth daily.   aspirin 81 MG chewable tablet Chew by mouth daily.   atorvastatin (LIPITOR) 80 MG tablet Take 1 tablet (80 mg total) by mouth daily.   Continuous Blood Gluc Receiver (DSwift DTensedSee admin instructions.   hydrochlorothiazide (HYDRODIURIL) 25 MG tablet Take 1 tablet (25 mg total) by mouth daily.   metFORMIN (GLUCOPHAGE) 500 MG tablet Take 1 tablet (500 mg total) by mouth 2 (two) times daily with a meal.   metoprolol (TOPROL-XL) 200 MG 24 hr tablet Take 1 tablet (200 mg total) by mouth daily.   potassium chloride SA (KLOR-CON M) 20 MEQ tablet Take 1 tablet (20 mEq total) by mouth 2 (two) times daily.    prednisoLONE acetate (PRED FORTE) 1 % ophthalmic suspension Place 1 drop into the right eye 3 (three) times daily.   No facility-administered encounter medications on file as of 09/22/2022.    Allergies (verified) Patient has no known allergies.   History: Past Medical History:  Diagnosis Date   Arthritis    Blind    Chicken pox    Colon polyp    Diabetes mellitus without complication (HCC)    Glaucoma    Heart murmur    Hypercholesterolemia    Hypertension    Ingrowing toenail 12/02/2021   Past Surgical History:  Procedure Laterality Date   CATARACT EXTRACTION     CESAREAN SECTION  1971   COLONOSCOPY  2022   TONSILLECTOMY  1955   Family History  Problem Relation Age of Onset   Hypertension Mother    Arthritis Mother    Heart attack Mother    Heart disease Mother    Hypertension Father    Arthritis Father    Hypertension Sister    Arthritis Sister    Hearing loss Sister    Hypertension Son    Diabetes Son    Asthma Son    Depression Son    Social History   Socioeconomic History   Marital status: Widowed    Spouse name: Not on file   Number of children: Not on file   Years of education: Not on file   Highest education level: Not on  file  Occupational History   Not on file  Tobacco Use   Smoking status: Never    Passive exposure: Never   Smokeless tobacco: Never  Vaping Use   Vaping Use: Never used  Substance and Sexual Activity   Alcohol use: No   Drug use: Never   Sexual activity: Not Currently  Other Topics Concern   Not on file  Social History Narrative   Marital status/children/pets: Widow.  Widow   Education/employment: Retired, high school education   Safety:      -smoke alarm in the home:Yes     - wears seatbelt: Yes     - Feels safe in their relationships: Yes      Social Determinants of Health   Financial Resource Strain: Low Risk  (09/22/2022)   Overall Financial Resource Strain (CARDIA)    Difficulty of Paying Living Expenses:  Not hard at all  Food Insecurity: No Food Insecurity (09/22/2022)   Hunger Vital Sign    Worried About Running Out of Food in the Last Year: Never true    Ran Out of Food in the Last Year: Never true  Transportation Needs: No Transportation Needs (09/22/2022)   PRAPARE - Hydrologist (Medical): No    Lack of Transportation (Non-Medical): No  Physical Activity: Inactive (09/22/2022)   Exercise Vital Sign    Days of Exercise per Week: 0 days    Minutes of Exercise per Session: 0 min  Stress: No Stress Concern Present (09/22/2022)   [place]    Feeling of Stress : Not at all  Social Connections: Socially Isolated (09/22/2022)   Social Connection and Isolation Panel [NHANES]    Frequency of Communication with Friends and Family: More than three times a week    Frequency of Social Gatherings with Friends and Family: Once a week    Attends Religious Services: Never    Marine scientist or Organizations: No    Attends Archivist Meetings: Never    Marital Status: Widowed    Tobacco Counseling Counseling given: Not Answered   Clinical Intake:  Pre-visit preparation completed: Yes  Pain : No/denies pain     BMI - recorded: 26.46 Nutritional Status: BMI 25 -29 Overweight Nutritional Risks: None Diabetes: Yes CBG done?: Yes (157 per pt) CBG resulted in Enter/ Edit results?: No Did pt. bring in CBG monitor from home?: No  How often do you need to have someone help you when you read instructions, pamphlets, or other written materials from your doctor or pharmacy?: 1 - Never  Diabetic?Nutrition Risk Assessment:  Has the patient had any N/V/D within the last 2 months?  No  Does the patient have any non-healing wounds?  No  Has the patient had any unintentional weight loss or weight gain?  No   Diabetes:  Is the patient diabetic?  Yes  If diabetic, was a CBG obtained  today?  Yes  Did the patient bring in their glucometer from home?  No  How often do you monitor your CBG's? Dexacom .   Financial Strains and Diabetes Management:  Are you having any financial strains with the device, your supplies or your medication? No .  Does the patient want to be seen by Chronic Care Management for management of their diabetes?  No  Would the patient like to be referred to a Nutritionist or for Diabetic Management?  No   Diabetic Exams:  Diabetic  Eye Exam: Completed 08/02/22 Diabetic Foot Exam: Completed 09/08/22   Interpreter Needed?: No  Information entered by :: Charlott Rakes, LPN   Activities of Daily Living    09/22/2022   11:26 AM  In your present state of health, do you have any difficulty performing the following activities:  Hearing? 0  Vision? 0  Difficulty concentrating or making decisions? 0  Walking or climbing stairs? 0  Dressing or bathing? 0  Doing errands, shopping? 0  Preparing Food and eating ? N  Using the Toilet? N  In the past six months, have you accidently leaked urine? N  Do you have problems with loss of bowel control? N  Managing your Medications? N  Managing your Finances? N  Housekeeping or managing your Housekeeping? N    Patient Care Team: Ma Hillock, DO as PCP - General (Family Medicine) Bond, Tracie Harrier, MD as Referring Physician (Ophthalmology)  Indicate any recent Medical Services you may have received from other than [facility] providers in the past year (date may be approximate).     Assessment:   This is a routine wellness examination for Arkie.  Hearing/Vision screen Hearing Screening - Comments:: Pt denies any hearing issues  Vision Screening - Comments:: Pt follows up with Dr Edilia Bo for annual eye exams   Dietary issues and exercise activities discussed: Current Exercise Habits: The patient does not participate in regular exercise at present   Goals Addressed             This Visit's Progress     Patient Stated       Get better        Depression Screen    09/22/2022   11:23 AM 09/08/2022    1:27 PM  PHQ 2/9 Scores  PHQ - 2 Score 1 0  PHQ- 9 Score  1    Fall Risk    09/22/2022   11:26 AM 09/08/2022    1:26 PM  Fall Risk   Falls in the past year? 0 0  Number falls in past yr: 0 0  Injury with Fall? 0 0  Risk for fall due to : Impaired vision Impaired balance/gait;Impaired vision  Follow up Falls prevention discussed Falls evaluation completed    FALL RISK PREVENTION PERTAINING TO THE HOME:  Any stairs in or around the home? No  If so, are there any without handrails? No  Home free of loose throw rugs in walkways, pet beds, electrical cords, etc? Yes  Adequate lighting in your home to reduce risk of falls? Yes   ASSISTIVE DEVICES UTILIZED TO PREVENT FALLS:  Life alert? No  Use of a cane, walker or w/c? Yes  Grab bars in the bathroom? No  Shower chair or bench in shower? No  Elevated toilet seat or a handicapped toilet? No   TIMED UP AND GO:  Was the test performed? No .  Cognitive Function:        09/22/2022   11:27 AM  6CIT Screen  What Year? 0 points  What month? 0 points  What time? 0 points  Count back from 20 0 points  Months in reverse 0 points  Repeat phrase 0 points  Total Score 0 points    Immunizations Immunization History  Administered Date(s) Administered   Fluad Quad(high Dose 65+) 05/30/2020, 05/02/2022   PFIZER(Purple Top)SARS-COV-2 Vaccination 11/08/2019, 11/29/2019, 05/27/2020   PNEUMOCOCCAL CONJUGATE-20 05/04/2022   RSV,unspecified 05/04/2022   Unspecified SARS-COV-2 Vaccination 05/03/2022   Zoster Recombinat (Shingrix) 06/11/2021, 07/13/2021  TDAP status: Due, Education has been provided regarding the importance of this vaccine. Advised may receive this vaccine at local pharmacy or Health Dept. Aware to provide a copy of the vaccination record if obtained from local pharmacy or Health Dept. Verbalized acceptance and  understanding.  Flu Vaccine status: Up to date  Pneumococcal vaccine status: Up to date  Covid-19 vaccine status: Completed vaccines  Qualifies for Shingles Vaccine? Yes   Zostavax completed Yes   Shingrix Completed?: Yes  Screening Tests Health Maintenance  Topic Date Due   Hepatitis C Screening  Never done   DTaP/Tdap/Td (1 - Tdap) Never done   HEMOGLOBIN A1C  03/10/2023   OPHTHALMOLOGY EXAM  08/03/2023   Diabetic kidney evaluation - eGFR measurement  09/09/2023   Diabetic kidney evaluation - Urine ACR  09/09/2023   FOOT EXAM  09/09/2023   Medicare Annual Wellness (AWV)  09/23/2023   Pneumonia Vaccine 34+ Years old  Completed   INFLUENZA VACCINE  Completed   DEXA SCAN  Completed   Zoster Vaccines- Shingrix  Completed   HPV VACCINES  Aged Out   COVID-19 Vaccine  Discontinued    Health Maintenance  Health Maintenance Due  Topic Date Due   Hepatitis C Screening  Never done   DTaP/Tdap/Td (1 - Tdap) Never done    Colorectal cancer screening: No longer required.   Mammogram status: No longer required due to age.  Bone Density status: Completed 03/01/20. Results reflect: Bone density results: OSTEOPOROSIS. Repeat every 2 years.   Additional Screening:  Hepatitis C Screening: does qualify;   Vision Screening: Recommended annual ophthalmology exams for early detection of glaucoma and other disorders of the eye. Is the patient up to date with their annual eye exam?  Yes  Who is the provider or what is the name of the office in which the patient attends annual eye exams? Dr Zigmund Daniel  If pt is not established with a provider, would they like to be referred to a provider to establish care? No .   Dental Screening: Recommended annual dental exams for proper oral hygiene  Community Resource Referral / Chronic Care Management: CRR required this visit?  No   CCM required this visit?  No      Plan:     I have personally reviewed and noted the following in the  patient's chart:   Medical and social history Use of alcohol, tobacco or illicit drugs  Current medications and supplements including opioid prescriptions. Patient is not currently taking opioid prescriptions. Functional ability and status Nutritional status Physical activity Advanced directives List of other physicians Hospitalizations, surgeries, and ER visits in previous 12 months Vitals Screenings to include cognitive, depression, and falls Referrals and appointments  In addition, I have reviewed and discussed with patient certain preventive protocols, quality metrics, and best practice recommendations. A written personalized care plan for preventive services as well as general preventive health recommendations were provided to patient.     Willette Brace, LPN   [id number]   Nurse Notes: none

## 2022-09-22 NOTE — Patient Instructions (Signed)
[name] , Thank you for taking time to come for your Medicare Wellness Visit. I appreciate your ongoing commitment to your health goals. Please review the following plan we discussed and let me know if I can assist you in the future.   These are the goals we discussed:  Goals      Patient Stated     Get better         This is a list of the screening recommended for you and due dates:  Health Maintenance  Topic Date Due   Hepatitis C Screening: USPSTF Recommendation to screen - Ages 55-79 yo.  Never done   DTaP/Tdap/Td vaccine (1 - Tdap) Never done   Hemoglobin A1C  03/10/2023   Eye exam for diabetics  08/03/2023   Yearly kidney function blood test for diabetes  09/09/2023   Yearly kidney health urinalysis for diabetes  09/09/2023   Complete foot exam   09/09/2023   Medicare Annual Wellness Visit  09/23/2023   Pneumonia Vaccine  Completed   Flu Shot  Completed   DEXA scan (bone density measurement)  Completed   Zoster (Shingles) Vaccine  Completed   HPV Vaccine  Aged Out   COVID-19 Vaccine  Discontinued    Advanced directives: Advance directive discussed with you today. Even though you declined this today please call our office should you change your mind and we can give you the proper paperwork for you to fill out.  Conditions/risks identified: get better   Next appointment: Follow up in one year for your annual wellness visit    Preventive Care 65 Years and Older, Female Preventive care refers to lifestyle choices and visits with your health care provider that can promote health and wellness. What does preventive care include? A yearly physical exam. This is also called an annual well check. Dental exams once or twice a year. Routine eye exams. Ask your health care provider how often you should have your eyes checked. Personal lifestyle choices, including: Daily care of your teeth and gums. Regular physical activity. Eating a healthy diet. Avoiding tobacco and drug  use. Limiting alcohol use. Practicing safe sex. Taking low-dose aspirin every day. Taking vitamin and mineral supplements as recommended by your health care provider. What happens during an annual well check? The services and screenings done by your health care provider during your annual well check will depend on your age, overall health, lifestyle risk factors, and family history of disease. Counseling  Your health care provider may ask you questions about your: Alcohol use. Tobacco use. Drug use. Emotional well-being. Home and relationship well-being. Sexual activity. Eating habits. History of falls. Memory and ability to understand (cognition). Work and work Statistician. Reproductive health. Screening  You may have the following tests or measurements: Height, weight, and BMI. Blood pressure. Lipid and cholesterol levels. These may be checked every 5 years, or more frequently if you are over 76 years old. Skin check. Lung cancer screening. You may have this screening every year starting at age 72 if you have a 30-pack-year history of smoking and currently smoke or have quit within the past 15 years. Fecal occult blood test (FOBT) of the stool. You may have this test every year starting at age 49. Flexible sigmoidoscopy or colonoscopy. You may have a sigmoidoscopy every 5 years or a colonoscopy every 10 years starting at age 96. Hepatitis C blood test. Hepatitis B blood test. Sexually transmitted disease (STD) testing. Diabetes screening. This is done by checking your blood  sugar (glucose) after you have not eaten for a while (fasting). You may have this done every 1-3 years. Bone density scan. This is done to screen for osteoporosis. You may have this done starting at age 53. Mammogram. This may be done every 1-2 years. Talk to your health care provider about how often you should have regular mammograms. Talk with your health care provider about your test results, treatment  options, and if necessary, the need for more tests. Vaccines  Your health care provider may recommend certain vaccines, such as: Influenza vaccine. This is recommended every year. Tetanus, diphtheria, and acellular pertussis (Tdap, Td) vaccine. You may need a Td booster every 10 years. Zoster vaccine. You may need this after age 68. Pneumococcal 13-valent conjugate (PCV13) vaccine. One dose is recommended after age 49. Pneumococcal polysaccharide (PPSV23) vaccine. One dose is recommended after age 9. Talk to your health care provider about which screenings and vaccines you need and how often you need them. This information is not intended to replace advice given to you by your health care provider. Make sure you discuss any questions you have with your health care provider. Document Released: 08/15/2015 Document Revised: 04/07/2016 Document Reviewed: 05/20/2015 Elsevier Interactive Patient Education  2017 [place] Prevention in the Home Falls can cause injuries. They can happen to people of all ages. There are many things you can do to make your home safe and to help prevent falls. What can I do on the outside of my home? Regularly fix the edges of walkways and driveways and fix any cracks. Remove anything that might make you trip as you walk through a door, such as a raised step or threshold. Trim any bushes or trees on the path to your home. Use bright outdoor lighting. Clear any walking paths of anything that might make someone trip, such as rocks or tools. Regularly check to see if handrails are loose or broken. Make sure that both sides of any steps have handrails. Any raised decks and porches should have guardrails on the edges. Have any leaves, snow, or ice cleared regularly. Use sand or salt on walking paths during winter. Clean up any spills in your garage right away. This includes oil or grease spills. What can I do in the bathroom? Use night lights. Install grab  bars by the toilet and in the tub and shower. Do not use towel bars as grab bars. Use non-skid mats or decals in the tub or shower. If you need to sit down in the shower, use a plastic, non-slip stool. Keep the floor dry. Clean up any water that spills on the floor as soon as it happens. Remove soap buildup in the tub or shower regularly. Attach bath mats securely with double-sided non-slip rug tape. Do not have throw rugs and other things on the floor that can make you trip. What can I do in the bedroom? Use night lights. Make sure that you have a light by your bed that is easy to reach. Do not use any sheets or blankets that are too big for your bed. They should not hang down onto the floor. Have a firm chair that has side arms. You can use this for support while you get dressed. Do not have throw rugs and other things on the floor that can make you trip. What can I do in the kitchen? Clean up any spills right away. Avoid walking on wet floors. Keep items that you use a lot in easy-to-reach  places. If you need to reach something above you, use a strong step stool that has a grab bar. Keep electrical cords out of the way. Do not use floor polish or wax that makes floors slippery. If you must use wax, use non-skid floor wax. Do not have throw rugs and other things on the floor that can make you trip. What can I do with my stairs? Do not leave any items on the stairs. Make sure that there are handrails on both sides of the stairs and use them. Fix handrails that are broken or loose. Make sure that handrails are as long as the stairways. Check any carpeting to make sure that it is firmly attached to the stairs. Fix any carpet that is loose or worn. Avoid having throw rugs at the top or bottom of the stairs. If you do have throw rugs, attach them to the floor with carpet tape. Make sure that you have a light switch at the top of the stairs and the bottom of the stairs. If you do not have them,  ask someone to add them for you. What else can I do to help prevent falls? Wear shoes that: Do not have high heels. Have rubber bottoms. Are comfortable and fit you well. Are closed at the toe. Do not wear sandals. If you use a stepladder: Make sure that it is fully opened. Do not climb a closed stepladder. Make sure that both sides of the stepladder are locked into place. Ask someone to hold it for you, if possible. Clearly mark and make sure that you can see: Any grab bars or handrails. First and last steps. Where the edge of each step is. Use tools that help you move around (mobility aids) if they are needed. These include: Canes. Walkers. Scooters. Crutches. Turn on the lights when you go into a dark area. Replace any light bulbs as soon as they burn out. Set up your furniture so you have a clear path. Avoid moving your furniture around. If any of your floors are uneven, fix them. If there are any pets around you, be aware of where they are. Review your medicines with your doctor. Some medicines can make you feel dizzy. This can increase your chance of falling. Ask your doctor what other things that you can do to help prevent falls. This information is not intended to replace advice given to you by your health care provider. Make sure you discuss any questions you have with your health care provider. Document Released: 05/15/2009 Document Revised: 12/25/2015 Document Reviewed: 08/23/2014 Elsevier Interactive Patient Education  2017 [employer].

## 2022-11-03 ENCOUNTER — Ambulatory Visit (INDEPENDENT_AMBULATORY_CARE_PROVIDER_SITE_OTHER): Payer: Medicare Other | Admitting: Family Medicine

## 2022-11-03 ENCOUNTER — Encounter: Payer: Self-pay | Admitting: Family Medicine

## 2022-11-03 VITALS — BP 122/78 | HR 66 | Temp 98.5°F | Wt 127.2 lb

## 2022-11-03 DIAGNOSIS — Z0184 Encounter for antibody response examination: Secondary | ICD-10-CM | POA: Diagnosis not present

## 2022-11-03 DIAGNOSIS — E1169 Type 2 diabetes mellitus with other specified complication: Secondary | ICD-10-CM

## 2022-11-03 DIAGNOSIS — E785 Hyperlipidemia, unspecified: Secondary | ICD-10-CM

## 2022-11-03 DIAGNOSIS — H44512 Absolute glaucoma, left eye: Secondary | ICD-10-CM

## 2022-11-03 DIAGNOSIS — L710 Perioral dermatitis: Secondary | ICD-10-CM | POA: Insufficient documentation

## 2022-11-03 DIAGNOSIS — H548 Legal blindness, as defined in USA: Secondary | ICD-10-CM

## 2022-11-03 DIAGNOSIS — E11311 Type 2 diabetes mellitus with unspecified diabetic retinopathy with macular edema: Secondary | ICD-10-CM

## 2022-11-03 DIAGNOSIS — H401113 Primary open-angle glaucoma, right eye, severe stage: Secondary | ICD-10-CM

## 2022-11-03 NOTE — Progress Notes (Signed)
Patient ID: Sara Moreno, female  DOB: 08-Oct-1945, 77 y.o.   MRN: [id number] Patient Care Team    Relationship Specialty Notifications Start End  Ma Hillock, DO PCP - General Family Medicine  09/08/22   Bond, Tracie Harrier, MD Referring Physician Ophthalmology  09/08/22     Chief Complaint  Patient presents with   Rash    Subjective:  Sara Moreno is a 77 y.o.  female present for acute concerns All past medical history, surgical history, allergies, family history, immunizations, medications and social history were updated in the electronic medical record today. All recent labs, ED visits and hospitalizations within the last year were reviewed.  Perioral dermatitis: Patient's noticed over the last months she has had a rash breakout or her chin and around her mouth.  She has been using alcohol wipes to help resolve.  She does shave this area and has been using an Copy.  She has a history of glaucoma and she is blind.  She had undergone cataract surgery in which was hoping to improve her vision, however that has not been the case.  She is a diabetic, controlled on metformin only.  She would like to seek a second opinion surrounding her eye psych to see if there is anything else she could do to help her with her vision.  Pt desires to know if she is immune to Measles      09/22/2022   11:23 AM 09/08/2022    1:27 PM  Depression screen PHQ 2/9  Decreased Interest 0 0  Down, Depressed, Hopeless 1 0  PHQ - 2 Score 1 0  Altered sleeping  0  Tired, decreased energy  0  Change in appetite  0  Feeling bad or failure about yourself   1  Trouble concentrating  0  Moving slowly or fidgety/restless  0  Suicidal thoughts  0  PHQ-9 Score  1  Difficult doing work/chores  Very difficult      09/08/2022    1:27 PM  GAD 7 : Generalized Anxiety Score  Nervous, Anxious, on Edge 1  Control/stop worrying 2  Worry too much - different things 2  Trouble relaxing 0  Restless 0   Easily annoyed or irritable 0  Afraid - awful might happen 0  Total GAD 7 Score 5  Anxiety Difficulty Not difficult at all          09/22/2022   11:26 AM 09/08/2022    1:26 PM  Fall Risk   Falls in the past year? 0 0  Number falls in past yr: 0 0  Injury with Fall? 0 0  Risk for fall due to : Impaired vision Impaired balance/gait;Impaired vision  Follow up Falls prevention discussed Falls evaluation completed     Immunization History  Administered Date(s) Administered   Fluad Quad(high Dose 65+) 05/30/2020, 05/02/2022   PFIZER(Purple Top)SARS-COV-2 Vaccination 11/08/2019, 11/29/2019, 05/27/2020   PNEUMOCOCCAL CONJUGATE-20 05/04/2022   RSV,unspecified 05/04/2022   Unspecified SARS-COV-2 Vaccination 05/03/2022   Zoster Recombinat (Shingrix) 06/11/2021, 07/13/2021    No results found.  Past Medical History:  Diagnosis Date   Arthritis    Blind    Chicken pox    Colon polyp    Diabetes mellitus without complication    Glaucoma    Heart murmur    Hypercholesterolemia    Hypertension    Ingrowing toenail 12/02/2021   No Known Allergies  Past Surgical History:  Procedure Laterality Date  CATARACT EXTRACTION     CESAREAN SECTION  1971   COLONOSCOPY  2022   TONSILLECTOMY  1955   Family History  Problem Relation Age of Onset   Hypertension Mother    Arthritis Mother    Heart attack Mother    Heart disease Mother    Hypertension Father    Arthritis Father    Hypertension Sister    Arthritis Sister    Hearing loss Sister    Hypertension Son    Diabetes Son    Asthma Son    Depression Son    Social History   Social History Narrative   Marital status/children/pets: Widow.  Widow   Education/employment: Retired, high school education   Safety:      -smoke alarm in the home:Yes     - wears seatbelt: Yes     - Feels safe in their relationships: Yes       Allergies as of 11/03/2022   No Known Allergies      Medication List        Accurate as of  November 03, 2022  2:16 PM. If you have any questions, ask your nurse or doctor.          alendronate 70 MG tablet Commonly known as: FOSAMAX Take 1 tablet (70 mg total) by mouth once a week. Empty stomach with a full glass of water   amLODipine 10 MG tablet Commonly known as: NORVASC Take 1 tablet (10 mg total) by mouth daily.   aspirin 81 MG chewable tablet Chew by mouth daily.   atorvastatin 80 MG tablet Commonly known as: LIPITOR Take 1 tablet (80 mg total) by mouth daily.   Dexcom G7 Receiver Kerrin Mo See admin instructions.   hydrochlorothiazide 25 MG tablet Commonly known as: HYDRODIURIL Take 1 tablet (25 mg total) by mouth daily.   metFORMIN 500 MG tablet Commonly known as: GLUCOPHAGE Take 1 tablet (500 mg total) by mouth 2 (two) times daily with a meal.   metoprolol 200 MG 24 hr tablet Commonly known as: TOPROL-XL Take 1 tablet (200 mg total) by mouth daily.   potassium chloride SA 20 MEQ tablet Commonly known as: KLOR-CON M Take 1 tablet (20 mEq total) by mouth 2 (two) times daily.   prednisoLONE acetate 1 % ophthalmic suspension Commonly known as: PRED FORTE Place 1 drop into the right eye 3 (three) times daily.        All past medical history, surgical history, allergies, family history, immunizations andmedications were updated in the EMR today and reviewed under the history and medication portions of their EMR.      No results found.   ROS 14 pt review of systems performed and negative (unless mentioned in an HPI)  Objective: BP 122/78   Pulse 66   Temp 98.5 F (36.9 C)   Wt 127 lb 3.2 oz (57.7 kg)   SpO2 98%   BMI 25.69 kg/m  Physical Exam Vitals and nursing note reviewed.  Constitutional:      General: She is not in acute distress.    Appearance: Normal appearance. She is not ill-appearing, toxic-appearing or diaphoretic.     Comments: Legally blind  HENT:     Head: Normocephalic and atraumatic.  Eyes:     General: No scleral icterus.        Right eye: No discharge.        Left eye: No discharge.     Extraocular Movements: Extraocular movements intact.     Conjunctiva/sclera:  Conjunctivae normal.     Pupils: Pupils are equal, round, and reactive to light.  Cardiovascular:     Rate and Rhythm: Normal rate and regular rhythm.  Pulmonary:     Effort: Pulmonary effort is normal. No respiratory distress.     Breath sounds: Normal breath sounds. No wheezing, rhonchi or rales.  Musculoskeletal:     Right lower leg: No edema.     Left lower leg: No edema.  Skin:    General: Skin is warm.     Findings: Rash (Fine rash and dryness surrounding mouth and chin.) present.  Neurological:     Mental Status: She is alert and oriented to person, place, and time. Mental status is at baseline.     Motor: No weakness.     Gait: Gait normal.  Psychiatric:        Mood and Affect: Mood normal.        Behavior: Behavior normal.        Thought Content: Thought content normal.        Judgment: Judgment normal.     Diabetic Foot Exam - Simple   No data filed     Assessment/plan: Sara Moreno is a 77 y.o. 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  If calcium still elevated would ensure they are not supplementing with a calcium. - PTH, Intact and Calcium - Vitamin D (25 hydroxy)  Immunity status testing - Measles/Mumps/Rubella Immunity If nonimmune would need to follow CDC recommendations for age  Perioral dermatitis Skin appears dry and irritated. Encouraged her to discontinue use of alcohol swabs Clean with gentle soap and warm water daily Apply light coat of CeraVe ointment daily If shaving, would encourage use of moisturizing foam and razor, avoiding electric razor.    Patient has appointment for her  chronic conditions already scheduled  Orders Placed This Encounter  Procedures   PTH, Intact and Calcium   Vitamin D (25 hydroxy)   Measles/Mumps/Rubella Immunity   Ambulatory referral to Ophthalmology   No orders of the defined types were placed in this encounter.  Referral Orders         Ambulatory referral to Ophthalmology     59 minutes dedicated to patient today.  Time spent establishing patient, counseling patient and family, establishing patient by reviewing records and correcting multiple medication discrepancies.  Note is dictated utilizing voice recognition software. Although note has been proof read prior to signing, occasional typographical errors still can be missed. If any questions arise, please do not hesitate to call for verification.  Electronically signed by: Howard Pouch, DO [place]

## 2022-11-03 NOTE — Patient Instructions (Signed)
No follow-ups on file.        Great to see you today.  I have refilled the medication(s) we provide.   If labs were collected, we will inform you of lab results once received either by echart message or telephone call.   - echart message- for normal results that have been seen by the patient already.   - telephone call: abnormal results or if patient has not viewed results in their echart.  

## 2022-11-04 ENCOUNTER — Telehealth: Payer: Self-pay | Admitting: Family Medicine

## 2022-11-04 LAB — PTH, INTACT AND CALCIUM
Calcium: 10.9 mg/dL — ABNORMAL HIGH (ref 8.6–10.4)
PTH: 18 pg/mL (ref 16–77)

## 2022-11-04 LAB — TIQ-NTM

## 2022-11-04 LAB — MEASLES/MUMPS/RUBELLA IMMUNITY
Mumps IgG: 79.8 AU/mL
Rubella: 3.21 Index
Rubeola IgG: >300 AU/mL

## 2022-11-04 LAB — VITAMIN D 25 HYDROXY (VIT D DEFICIENCY, FRACTURES): Vit D, 25-Hydroxy: 126 ng/mL — ABNORMAL HIGH (ref 30–100)

## 2022-11-04 NOTE — Telephone Encounter (Signed)
Spoke with patient regarding results/recommendations. Pt states she is taking vit d but is not taking calcium. Advised her to stop taking vit d

## 2022-11-04 NOTE — Telephone Encounter (Signed)
Please call patient Sara Moreno calcium is still high.  Sara Moreno vitamin D is extremely high and above normal.  Please ask Sara Moreno if she is taking calcium and vitamin D supplements.  There is nothing on Sara Moreno medication list suggesting so. If she is taking a calcium and vitamin D supplement, please advise Sara Moreno to stop both.    Will recheck these levels at Sara Moreno next appointment.  It takes quite a few months to get can vitamin D levels to start to decrease.    Encouraged Sara Moreno to hydrate as well.   If she is not taking any vitamin D or calcium supplements, then we would need to refer Sara Moreno to a specialty team.  Please advise on Sara Moreno answer

## 2022-11-05 ENCOUNTER — Telehealth: Payer: Self-pay | Admitting: Family Medicine

## 2022-11-05 NOTE — Telephone Encounter (Signed)
LVM for pt to return call regarding results.

## 2022-11-05 NOTE — Telephone Encounter (Signed)
Please inform patient she has immunity against the measles mumps and rubella.  She does not need a booster shot.

## 2022-12-11 DIAGNOSIS — H401113 Primary open-angle glaucoma, right eye, severe stage: Secondary | ICD-10-CM | POA: Diagnosis not present

## 2022-12-11 LAB — HM DIABETES EYE EXAM

## 2022-12-13 ENCOUNTER — Other Ambulatory Visit: Payer: Self-pay

## 2022-12-21 ENCOUNTER — Telehealth: Payer: Self-pay

## 2022-12-21 ENCOUNTER — Ambulatory Visit (INDEPENDENT_AMBULATORY_CARE_PROVIDER_SITE_OTHER): Payer: Medicare Other | Admitting: Family Medicine

## 2022-12-21 ENCOUNTER — Encounter: Payer: Self-pay | Admitting: Family Medicine

## 2022-12-21 VITALS — BP 136/82 | HR 64 | Temp 97.6°F | Wt 128.0 lb

## 2022-12-21 DIAGNOSIS — E785 Hyperlipidemia, unspecified: Secondary | ICD-10-CM

## 2022-12-21 DIAGNOSIS — H548 Legal blindness, as defined in USA: Secondary | ICD-10-CM

## 2022-12-21 DIAGNOSIS — I10 Essential (primary) hypertension: Secondary | ICD-10-CM

## 2022-12-21 DIAGNOSIS — E673 Hypervitaminosis D: Secondary | ICD-10-CM

## 2022-12-21 DIAGNOSIS — Z1231 Encounter for screening mammogram for malignant neoplasm of breast: Secondary | ICD-10-CM

## 2022-12-21 DIAGNOSIS — M810 Age-related osteoporosis without current pathological fracture: Secondary | ICD-10-CM | POA: Diagnosis not present

## 2022-12-21 DIAGNOSIS — E1169 Type 2 diabetes mellitus with other specified complication: Secondary | ICD-10-CM | POA: Diagnosis not present

## 2022-12-21 DIAGNOSIS — Z7984 Long term (current) use of oral hypoglycemic drugs: Secondary | ICD-10-CM | POA: Diagnosis not present

## 2022-12-21 DIAGNOSIS — E11311 Type 2 diabetes mellitus with unspecified diabetic retinopathy with macular edema: Secondary | ICD-10-CM

## 2022-12-21 DIAGNOSIS — M79674 Pain in right toe(s): Secondary | ICD-10-CM

## 2022-12-21 LAB — COMPREHENSIVE METABOLIC PANEL
ALT: 11 U/L (ref 0–35)
AST: 11 U/L (ref 0–37)
Albumin: 4.2 g/dL (ref 3.5–5.2)
Alkaline Phosphatase: 55 U/L (ref 39–117)
BUN: 19 mg/dL (ref 6–23)
CO2: 32 mEq/L (ref 19–32)
Calcium: 10 mg/dL (ref 8.4–10.5)
Chloride: 97 mEq/L (ref 96–112)
Creatinine, Ser: 0.92 mg/dL (ref 0.40–1.20)
GFR: 59.53 mL/min — ABNORMAL LOW (ref >60.00)
Glucose, Bld: 190 mg/dL — ABNORMAL HIGH (ref 70–99)
Potassium: 3.7 mEq/L (ref 3.5–5.1)
Sodium: 140 mEq/L (ref 135–145)
Total Bilirubin: 0.5 mg/dL (ref 0.2–1.2)
Total Protein: 6.8 g/dL (ref 6.0–8.3)

## 2022-12-21 LAB — VITAMIN D 25 HYDROXY (VIT D DEFICIENCY, FRACTURES): VITD: 111.84 ng/mL (ref 30.00–100.00)

## 2022-12-21 LAB — POCT GLYCOSYLATED HEMOGLOBIN (HGB A1C)
HbA1c POC (<> result, manual entry): 6.3 % (ref 4.0–5.6)
HbA1c, POC (controlled diabetic range): 6.3 % (ref 0.0–7.0)
HbA1c, POC (prediabetic range): 6.3 % (ref 5.7–6.4)
Hemoglobin A1C: 6.3 % — AB (ref 4.0–5.6)

## 2022-12-21 MED ORDER — METOPROLOL SUCCINATE ER 200 MG PO TB24: 200.0000 mg | ORAL_TABLET | Freq: Every day | ORAL | 1 refills | Status: DC

## 2022-12-21 MED ORDER — ATORVASTATIN CALCIUM 80 MG PO TABS: 80.0000 mg | ORAL_TABLET | Freq: Every day | ORAL | 1 refills | Status: DC

## 2022-12-21 MED ORDER — POTASSIUM CHLORIDE CRYS ER 20 MEQ PO TBCR: 20.0000 meq | EXTENDED_RELEASE_TABLET | Freq: Two times a day (BID) | ORAL | 1 refills | Status: DC

## 2022-12-21 MED ORDER — METFORMIN HCL 500 MG PO TABS: 500.0000 mg | ORAL_TABLET | Freq: Two times a day (BID) | ORAL | 1 refills | Status: DC

## 2022-12-21 MED ORDER — AMLODIPINE BESYLATE 10 MG PO TABS: 10.0000 mg | ORAL_TABLET | Freq: Every day | ORAL | 1 refills | Status: DC

## 2022-12-21 MED ORDER — HYDROCHLOROTHIAZIDE 25 MG PO TABS: 25.0000 mg | ORAL_TABLET | Freq: Every day | ORAL | 1 refills | Status: DC

## 2022-12-21 MED ORDER — MUPIROCIN 2 % EX OINT
1.0000 | TOPICAL_OINTMENT | Freq: Two times a day (BID) | CUTANEOUS | 0 refills | Status: DC
Start: 2022-12-21 — End: 2023-05-25

## 2022-12-21 NOTE — Progress Notes (Signed)
Patient ID: Sara Moreno, female  DOB: August 31, 1945, 77 y.o.   MRN: [id number] Patient Care Team    Relationship Specialty Notifications Start End  Natalia Leatherwood, DO PCP - General Family Medicine  09/08/22   Bond, Doran Stabler, MD Referring Physician Ophthalmology  09/08/22     Chief Complaint  Patient presents with   Diabetes    [facility], pt is not fasting    Subjective:  Sara Moreno is a 77 y.o.  female present for condition management appointment All past medical history, surgical history, allergies, family history, immunizations, medications and social history were updated in the electronic medical record today. All recent labs, ED visits and hospitalizations within the last year were reviewed.   Type 2 diabetes mellitus with hyperlipidemia (HCC) Pt reports compliance with metformin 500 twice daily and atorvastatin 80 mg nightly.  Patient denies dizziness, hyperglycemic or hypoglycemic events. Patient denies numbness, tingling in the extremities or nonhealing wounds of feet.  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 Patient denies chest pain, shortness of breath, dizziness or lower extremity edema.  Pt takes a daily baby ASA. Pt is  prescribed statin.  Age-related osteoporosis without current pathological fracture Patient reports compliance with the use of Fosamax 70 mg once weekly for her osteoporosis.  Last DEXA 2021.      11/03/2022    3:18 PM 09/22/2022   11:23 AM 09/08/2022    1:27 PM  Depression screen PHQ 2/9  Decreased Interest 0 0 0  Down, Depressed, Hopeless 0 1 0  PHQ - 2 Score 0 1 0  Altered sleeping 0  0  Tired, decreased energy 0  0  Change in appetite 0  0  Feeling bad or failure about yourself  0  1  Trouble  concentrating 0  0  Moving slowly or fidgety/restless 0  0  Suicidal thoughts 0  0  PHQ-9 Score 0  1  Difficult doing work/chores   Very difficult      09/08/2022    1:27 PM  GAD 7 : Generalized Anxiety Score  Nervous, Anxious, on Edge 1  Control/stop worrying 2  Worry too much - different things 2  Trouble relaxing 0  Restless 0  Easily annoyed or irritable 0  Afraid - awful might happen 0  Total GAD 7 Score 5  Anxiety Difficulty Not difficult at all          11/03/2022    3:17 PM 09/22/2022   11:26 AM 09/08/2022    1:26 PM  Fall Risk   Falls in the past year? 0 0 0  Number falls in past yr:  0 0  Injury with Fall?  0 0  Risk for fall due to :  Impaired vision Impaired balance/gait;Impaired vision  Follow up  Falls prevention discussed Falls evaluation completed     Immunization History  Administered Date(s) Administered   Fluad Quad(high Dose 65+) 05/30/2020, 05/02/2022   PFIZER(Purple Top)SARS-COV-2 Vaccination 11/08/2019, 11/29/2019, 05/27/2020   PNEUMOCOCCAL CONJUGATE-20 05/04/2022   RSV,unspecified 05/04/2022   Unspecified SARS-COV-2 Vaccination 05/03/2022   Zoster Recombinat (Shingrix) 06/11/2021, 07/13/2021    No results found.  Past Medical History:  Diagnosis Date   Arthritis    Blind  Chicken pox    Colon polyp    Diabetes mellitus without complication (HCC)    Glaucoma    Heart murmur    Hypercholesterolemia    Hypertension    Ingrowing toenail 12/02/2021   No Known Allergies  Past Surgical History:  Procedure Laterality Date   CATARACT EXTRACTION     CESAREAN SECTION  1971   COLONOSCOPY  2022   TONSILLECTOMY  1955   Family History  Problem Relation Age of Onset   Hypertension Mother    Arthritis Mother    Heart attack Mother    Heart disease Mother    Hypertension Father    Arthritis Father    Hypertension Sister    Arthritis Sister    Hearing loss Sister    Hypertension Son    Diabetes Son    Asthma Son    Depression Son     Social History   Social History Narrative   Marital status/children/pets: Widow.  Widow   Education/employment: Retired, high school education   Safety:      -smoke alarm in the home:Yes     - wears seatbelt: Yes     - Feels safe in their relationships: Yes       Allergies as of 12/21/2022   No Known Allergies      Medication List        Accurate as of Dec 21, 2022  1:14 PM. If you have any questions, ask your nurse or doctor.          alendronate 70 MG tablet Commonly known as: FOSAMAX Take 1 tablet (70 mg total) by mouth once a week. Empty stomach with a full glass of water   amLODipine 10 MG tablet Commonly known as: NORVASC Take 1 tablet (10 mg total) by mouth daily.   aspirin 81 MG chewable tablet Chew by mouth daily.   atorvastatin 80 MG tablet Commonly known as: LIPITOR Take 1 tablet (80 mg total) by mouth daily.   brimonidine 0.2 % ophthalmic solution Commonly known as: ALPHAGAN Apply to eye.   Dexcom G7 Receiver Hardie Pulley See admin instructions.   Dexcom G7 Sensor Misc 1 AS DIRECTED   dorzolamide-timolol 2-0.5 % ophthalmic solution Commonly known as: COSOPT SMARTSIG:In Eye(s)   hydrochlorothiazide 25 MG tablet Commonly known as: HYDRODIURIL Take 1 tablet (25 mg total) by mouth daily.   metFORMIN 500 MG tablet Commonly known as: GLUCOPHAGE Take 1 tablet (500 mg total) by mouth 2 (two) times daily with a meal.   metoprolol 200 MG 24 hr tablet Commonly known as: TOPROL-XL Take 1 tablet (200 mg total) by mouth daily.   mupirocin ointment 2 % Commonly known as: BACTROBAN Apply 1 Application topically 2 (two) times daily. Apply BID to large toenail folds x 2 weeks Started by: Felix Pacini, DO   potassium chloride SA 20 MEQ tablet Commonly known as: KLOR-CON M Take 1 tablet (20 mEq total) by mouth 2 (two) times daily.   prednisoLONE acetate 1 % ophthalmic suspension Commonly known as: PRED FORTE Place 1 drop into the right eye 3 (three)  times daily.   prednisoLONE acetate 1 % ophthalmic suspension Commonly known as: PRED FORTE Apply to eye.        All past medical history, surgical history, allergies, family history, immunizations andmedications were updated in the EMR today and reviewed under the history and medication portions of their EMR.      No results found.   ROS 14 pt review of systems performed  and negative (unless mentioned in an HPI)  Objective: BP 136/82   Pulse 64   Temp 97.6 F (36.4 C)   Wt 128 lb (58.1 kg)   SpO2 99%   BMI 25.85 kg/m  Physical Exam Vitals and nursing note reviewed.  Constitutional:      General: She is not in acute distress.    Appearance: Normal appearance. She is not ill-appearing, toxic-appearing or diaphoretic.  HENT:     Head: Normocephalic and atraumatic.  Eyes:     General: No scleral icterus.       Right eye: No discharge.        Left eye: No discharge.     Extraocular Movements: Extraocular movements intact.     Conjunctiva/sclera: Conjunctivae normal.     Pupils: Pupils are equal, round, and reactive to light.  Cardiovascular:     Rate and Rhythm: Normal rate and regular rhythm.     Heart sounds: No murmur heard. Pulmonary:     Effort: Pulmonary effort is normal. No respiratory distress.     Breath sounds: Normal breath sounds. No wheezing, rhonchi or rales.  Musculoskeletal:     Right lower leg: No edema.     Left lower leg: No edema.  Skin:    General: Skin is warm.     Findings: No rash.  Neurological:     Mental Status: She is alert and oriented to person, place, and time. Mental status is at baseline.     Motor: No weakness.     Gait: Gait normal.  Psychiatric:        Mood and Affect: Mood normal.        Behavior: Behavior normal.        Thought Content: Thought content normal.        Judgment: Judgment normal.     Assessment/plan: KAHLEY PINKERTON is a 77 y.o. female present for est care Type 2 diabetes mellitus with hyperlipidemia  (HCC) Stable Continue metformin 500 mg twice daily PNA series: completed 2023 Flu shot: UTD 2023 (recommneded yearly) Foot exam: 09/08/2022- completed Eye exam: completed 12/11/2022, Dr. Lottie Dawson Urine microalbumin UTD 09/2022 A1c: 6.8 > 6.3 today  Primary hypertension Stable Continue amlodipine 10 mg daily Continue metoprolol succinate 200 mg daily Continue HCTZ 25 mg daily Continue K-Dur 20 daily Continue ASA 81 mg Continue atorvastatin 80 mg Labs up-to-date 09/2022  Age-related osteoporosis without current pathological fracture/Hypervitaminosis D Patient reports she has been prescribed Fosamax 70 mg once weekly for her osteoporosis.  Last DEXA 2021. 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  HH order placed for [facility] aide assistance 2-3 x weekly  Return in about 24 weeks (around 06/07/2023) for Routine chronic condition follow-up.  Orders Placed This Encounter  Procedures   DG Bone Density   Comp Met (CMET)   Vitamin D (25 hydroxy)   Ambulatory referral to Podiatry   Ambulatory referral to Home Health   POCT HgB A1C   Meds ordered this encounter  Medications   mupirocin ointment (BACTROBAN) 2 %    Sig: Apply 1 Application topically 2 (two) times daily. Apply BID to large toenail folds x 2 weeks    Dispense:  22 g    Refill:  0   amLODipine (NORVASC) 10 MG tablet  Sig: Take 1 tablet (10 mg total) by mouth daily.    Dispense:  90 tablet    Refill:  1   atorvastatin (LIPITOR) 80 MG tablet    Sig: Take 1 tablet (80 mg total) by mouth daily.    Dispense:  90 tablet    Refill:  1   hydrochlorothiazide (HYDRODIURIL) 25 MG tablet    Sig: Take 1 tablet (25 mg total) by mouth daily.    Dispense:  90 tablet    Refill:  1   metFORMIN (GLUCOPHAGE) 500 MG tablet     Sig: Take 1 tablet (500 mg total) by mouth 2 (two) times daily with a meal.    Dispense:  180 tablet    Refill:  1   metoprolol (TOPROL-XL) 200 MG 24 hr tablet    Sig: Take 1 tablet (200 mg total) by mouth daily.    Dispense:  90 tablet    Refill:  1   potassium chloride SA (KLOR-CON M) 20 MEQ tablet    Sig: Take 1 tablet (20 mEq total) by mouth 2 (two) times daily.    Dispense:  90 tablet    Refill:  1   Referral Orders         Ambulatory referral to Podiatry         Ambulatory referral to Home Health      Note is dictated utilizing voice recognition software. Although note has been proof read prior to signing, occasional typographical errors still can be missed. If any questions arise, please do not hesitate to call for verification.  Electronically signed by: Felix Pacini, DO [facility]

## 2022-12-21 NOTE — Patient Instructions (Addendum)
Return in about 24 weeks (around 06/07/2023) for Routine chronic condition follow-up.        Great to see you today.  I have refilled the medication(s) we provide.   If labs were collected, we will inform you of lab results once received either by echart message or telephone call.   - echart message- for normal results that have been seen by the patient already.   - telephone call: abnormal results or if patient has not viewed results in their echart.  

## 2022-12-21 NOTE — Telephone Encounter (Signed)
Sara Moreno called to report critical lab for patient.  Patient's vit D level is 111.84

## 2022-12-22 ENCOUNTER — Telehealth: Payer: Self-pay | Admitting: Family Medicine

## 2022-12-22 ENCOUNTER — Ambulatory Visit: Payer: Medicare Other | Admitting: Family Medicine

## 2022-12-22 NOTE — Telephone Encounter (Signed)
LMV for pt to return call regarding results.

## 2022-12-22 NOTE — Telephone Encounter (Signed)
Please call patient Her vitamin D is still very high at 111.  Continue not to take vitamin D f until we see each other next time we will recheck the vitamin D levels then and see if she needs to restart.  Calcium levels are now coming down into normal range

## 2022-12-22 NOTE — Telephone Encounter (Signed)
Patient is aware of lab results and recommendations. She has no additional questions or concerns at this time.

## 2022-12-22 NOTE — Telephone Encounter (Signed)
noted 

## 2022-12-23 NOTE — Progress Notes (Signed)
Are you able to assist with this?

## 2022-12-28 ENCOUNTER — Telehealth: Payer: Self-pay | Admitting: Family Medicine

## 2022-12-28 NOTE — Telephone Encounter (Signed)
Domingo Cocking with [facility] called to get orders for Skilled nursing and PT. She mentioned the frequency was up to the provider.  She can be reached at [phone] Ext [phone]

## 2022-12-29 ENCOUNTER — Encounter: Payer: Self-pay | Admitting: Family Medicine

## 2022-12-29 NOTE — Progress Notes (Signed)
Noted  

## 2022-12-31 ENCOUNTER — Ambulatory Visit: Payer: Medicare Other | Admitting: Podiatry

## 2022-12-31 DIAGNOSIS — L600 Ingrowing nail: Secondary | ICD-10-CM

## 2022-12-31 NOTE — Progress Notes (Signed)
Subjective:  Patient ID: Sara Moreno, female    DOB: 04/09/1946,  MRN: [id number]  Chief Complaint  Patient presents with   Nail Problem    77 y.o. female presents with the above complaint.  Patient presents with bilateral hallux medial border ingrown pain for touch is progressive and worse hurts with ambulation of the pressure she would like to have removed.  She states with ambulation pain scale 7 out of 10 she is a diabetic with a last A1c of 6.2   Review of Systems: Negative except as noted in the HPI. Denies N/V/F/Ch.  Past Medical History:  Diagnosis Date   Arthritis    Blind    Chicken pox    Colon polyp    Diabetes mellitus without complication (HCC)    Glaucoma    Heart murmur    Hypercholesterolemia    Hypertension    Ingrowing toenail 12/02/2021    Current Outpatient Medications:    alendronate (FOSAMAX) 70 MG tablet, Take 1 tablet (70 mg total) by mouth once a week. Empty stomach with a full glass of water, Disp: 4 tablet, Rfl: 11   amLODipine (NORVASC) 10 MG tablet, Take 1 tablet (10 mg total) by mouth daily., Disp: 90 tablet, Rfl: 1   aspirin 81 MG chewable tablet, Chew by mouth daily., Disp: , Rfl:    atorvastatin (LIPITOR) 80 MG tablet, Take 1 tablet (80 mg total) by mouth daily., Disp: 90 tablet, Rfl: 1   brimonidine (ALPHAGAN) 0.2 % ophthalmic solution, Apply to eye., Disp: , Rfl:    Continuous Blood Gluc Receiver (DEXCOM G7 RECEIVER) DEVI, See admin instructions., Disp: , Rfl:    Continuous Glucose Sensor (DEXCOM G7 SENSOR) MISC, 1 AS DIRECTED, Disp: , Rfl:    dorzolamide-timolol (COSOPT) 2-0.5 % ophthalmic solution, SMARTSIG:In Eye(s), Disp: , Rfl:    hydrochlorothiazide (HYDRODIURIL) 25 MG tablet, Take 1 tablet (25 mg total) by mouth daily., Disp: 90 tablet, Rfl: 1   metFORMIN (GLUCOPHAGE) 500 MG tablet, Take 1 tablet (500 mg total) by mouth 2 (two) times daily with a meal., Disp: 180 tablet, Rfl: 1   metoprolol (TOPROL-XL) 200 MG 24 hr tablet, Take 1  tablet (200 mg total) by mouth daily., Disp: 90 tablet, Rfl: 1   mupirocin ointment (BACTROBAN) 2 %, Apply 1 Application topically 2 (two) times daily. Apply BID to large toenail folds x 2 weeks, Disp: 22 g, Rfl: 0   potassium chloride SA (KLOR-CON M) 20 MEQ tablet, Take 1 tablet (20 mEq total) by mouth 2 (two) times daily., Disp: 90 tablet, Rfl: 1   prednisoLONE acetate (PRED FORTE) 1 % ophthalmic suspension, Place 1 drop into the right eye 3 (three) times daily., Disp: , Rfl:   Social History   Tobacco Use  Smoking Status Never   Passive exposure: Never  Smokeless Tobacco Never    No Known Allergies Objective:  There were no vitals filed for this visit. There is no height or weight on file to calculate BMI. Constitutional Well developed. Well nourished.  Vascular Dorsalis pedis pulses palpable bilaterally. Posterior tibial pulses palpable bilaterally. Capillary refill normal to all digits.  No cyanosis or clubbing noted. Pedal hair growth normal.  Neurologic Normal speech. Oriented to person, place, and time. Epicritic sensation to light touch grossly present bilaterally.  Dermatologic Painful ingrowing nail at medial nail borders of the hallux nail bilaterally. No other open wounds. No skin lesions.  Orthopedic: Normal joint ROM without pain or crepitus bilaterally. No visible deformities. No bony  tenderness.   Radiographs: None Assessment:  No diagnosis found. Plan:  Patient was evaluated and treated and all questions answered.  Ingrown Nail, bilaterally -Patient elects to proceed with minor surgery to remove ingrown toenail removal today. Consent reviewed and signed by patient. -Ingrown nail excised. See procedure note. -Educated on post-procedure care including soaking. Written instructions provided and reviewed. -Patient to follow up in 2 weeks for nail check.  Procedure: Excision of Ingrown Toenail Location: Bilateral 1st toe medial nail borders. Anesthesia:  Lidocaine 1% plain; 1.5 mL and Marcaine 0.5% plain; 1.5 mL, digital block. Skin Prep: Betadine. Dressing: Silvadene; telfa; dry, sterile, compression dressing. Technique: Following skin prep, the toe was exsanguinated and a tourniquet was secured at the base of the toe. The affected nail border was freed, split with a nail splitter, and excised. Chemical matrixectomy was then performed with phenol and irrigated out with alcohol. The tourniquet was then removed and sterile dressing applied. Disposition: Patient tolerated procedure well. Patient to return in 2 weeks for follow-up.   Return in about 3 months (around 04/02/2023) for [employer].

## 2022-12-31 NOTE — Patient Instructions (Signed)

## 2023-01-03 NOTE — Telephone Encounter (Signed)
Okay for orders? 

## 2023-01-03 NOTE — Telephone Encounter (Signed)
Home health order was not placed for skilled nursing or PT.  The home health order was denied because she did not need skilled nurse or PT by the referral process. Patient is in need of a aide only, to help with errands and household needs. It appears there is another telephone note with the second conversation concerning this patient, in which we were supposed to receive papers concerning a home attendant referral?  I have not received any extra papers for them.   I am not certain what else needs to be done for her.  They may have to hire a home health aide from an agency, then we can write a letter for them stating the need.    It appears I can only place a referral for home health if a nurse or physical therapist needs to be involved.  _________________    For more information email [facility] In-home aide program consultant or call [phone].   Website: [email or web address]:

## 2023-01-03 NOTE — Telephone Encounter (Signed)
Spoke with Delice Bison regarding recommendations. Delice Bison states that daughter has told her that pt is needing PT/nursing

## 2023-01-03 NOTE — Telephone Encounter (Signed)
Sara Moreno with [facility] calling to check status of verbal order request.  Please call Sara Moreno at [phone]. She is requesting to mark urgent as patient has been waiting over a week.

## 2023-01-07 ENCOUNTER — Telehealth: Payer: Self-pay | Admitting: *Deleted

## 2023-01-07 NOTE — Progress Notes (Unsigned)
  Care Coordination  Outreach Note  01/07/2023 Name: Sara Moreno MRN: [id number] DOB: July 26, 1946   Care Coordination Outreach Attempts: An unsuccessful telephone outreach was attempted today to offer the patient information about available care coordination services.  Follow Up Plan:  Additional outreach attempts will be made to offer the patient care coordination information and services.   Encounter Outcome:  No Answer  Christie Nottingham  Care Coordination Care Guide  Direct Dial: [phone]

## 2023-01-13 NOTE — Progress Notes (Signed)
  Care Coordination  Outreach Note  01/13/2023 Name: Sara Moreno MRN: [id number] DOB: February 11, 1946   Care Coordination Outreach Attempts: A second unsuccessful outreach was attempted today to offer the patient with information about available care coordination services.  Follow Up Plan:  Additional outreach attempts will be made to offer the patient care coordination information and services.   Encounter Outcome:  No Answer   Gwenevere Ghazi  Care Coordination Care Guide  Direct Dial: [phone]

## 2023-01-13 NOTE — Progress Notes (Signed)
  Care Coordination   Note   01/13/2023 Name: FERN CANOVA MRN: [id number] DOB: September 29, 1945  LEONOR DARNELL is a 77 y.o. year old female who sees Kuneff, Renee A, DO for primary care. I reached out to [name] by phone today to offer care coordination services.   Care Coordination Consent Status: Patient agreed to services and verbal consent obtained.   Follow up plan:  Telephone appointment with care coordination team member scheduled for:  01/17/23  Encounter Outcome:  Pt. Scheduled  [facility] Coordination Care Guide  Direct Dial: [phone]

## 2023-01-14 NOTE — Telephone Encounter (Signed)
LM for pt to return call to discuss.  

## 2023-01-14 NOTE — Telephone Encounter (Signed)
When patient came in and we discussed this referral it did not involve physical therapy and it did not involve nursing. She needed to have somebody help her around the house with housework and getting to appointments.  Please call patient or patient's son, Sara Moreno , and if this referral request has involved outside of her helping with errands and getting to appointments, then work and have to meet virtually at least, to figure out what is the need so that the appropriate order could be put in.

## 2023-01-17 ENCOUNTER — Ambulatory Visit: Payer: Self-pay

## 2023-01-17 NOTE — Patient Outreach (Signed)
  Care Coordination   Initial Visit Note   01/17/2023 Name: Sara Moreno MRN: [id number] DOB: 1946/06/12  Sara Moreno is a 77 y.o. year old female who sees Kuneff, Renee A, DO for primary care. I spoke with  [name] by phone today.  What matters to the patients health and wellness today?  Patient would like home health services for PT/OT to address safety and mobility in her home related to a decline in her vision.    Goals Addressed             This Visit's Progress    Care Coordination Activities       Care Coordination Interventions: Determined the patient lives in the home with her son and daughter in law "Jeanice Lim" who participated in a joint visit with the patient Discussed the patients vision continues to decline and she is interested in resources to help her remain in the home as independent and safe as possible Assessed for patients eligibility to apply for Medicaid PCS - patients income is over the limit to be eligible for Medicaid Education provided on option to privately hire a caregiver if desired; Jeanice Lim is available and stable to assist with patient needs at this time. No current desire for a caregiver Discussed plan for SW to place a referral to [facility] to assess for home modifications/aids to assist with patients independence and safety in the home Reviewed the patient would like to receive home health PT/OT to assist with a home safety evaluation and work on mobility within the home as patients vision is Art gallery manager with patients primary care provider via in basket message to request orders if agreeable Assessed for patients interest in speak with LCSW Jenel Lucks to review coping skills related to loss of independence; patient is agreeable to speaking with LCSW. Appointment scheduled for June 20 at 10:30am Collaboration with LCSW to advise of interventions and plan          SDOH assessments and interventions completed:  Yes  SDOH  Interventions Today    Flowsheet Row Most Recent Value  SDOH Interventions   Food Insecurity Interventions Intervention Not Indicated  Housing Interventions Intervention Not Indicated  Transportation Interventions Intervention Not Indicated  Utilities Interventions Intervention Not Indicated        Care Coordination Interventions:  Yes, provided   Interventions Today    Flowsheet Row Most Recent Value  Chronic Disease   Chronic disease during today's visit Hypertension (HTN), Diabetes  General Interventions   General Interventions Discussed/Reviewed General Interventions Discussed, Programmer, applications, Doctor Visits, Communication with  Doctor Visits Discussed/Reviewed Doctor Visits Reviewed  Communication with Social Work, PCP/Specialists  Education Interventions   Education Provided Provided Education  Provided Verbal Education On Development worker, community, [employer]  Mental Health Interventions   Mental Health Discussed/Reviewed Refer to Social Work for counseling  Refer to Social Work for counseling regarding Anxiety/Coping  [loss of independence]        Follow up plan: Referral made to LCSW who will outreach the patient as scheduled on 6/20. BSW will continue to follow at this time.    Encounter Outcome:  Pt. Visit Completed   Bevelyn Ngo, BSW, CDP Social Worker, Certified Dementia Practitioner [facility] Care Management  Care Coordination [phone]

## 2023-01-17 NOTE — Patient Instructions (Signed)
Visit Information  Thank you for taking time to visit with me today. Please don't hesitate to contact me if I can be of assistance to you.   Following are the goals we discussed today:  - Engage with [facility] regarding home modifications and assistive devices  Your next appointment is by telephone on 6/20 at 10:30 with LCSW Jenel Lucks  Please call the care guide team at [phone] if you need to cancel or reschedule your appointment.   If you are experiencing a Mental Health or Behavioral Health Crisis or need someone to talk to, please go to [facility] [address], [place] [phone]) call 911  Patient verbalizes understanding of instructions and care plan provided today and agrees to view in MyChart. Active MyChart status and patient understanding of how to access instructions and care plan via MyChart confirmed with patient.     Bevelyn Ngo, BSW, CDP Social Worker, Certified Dementia Practitioner [facility] Care Management  Care Coordination [phone]

## 2023-01-18 NOTE — Telephone Encounter (Signed)
LM for pt and pt's son to return call to discuss.

## 2023-01-20 ENCOUNTER — Ambulatory Visit: Payer: Self-pay | Admitting: Licensed Clinical Social Worker

## 2023-01-20 NOTE — Patient Outreach (Signed)
  Care Coordination   Case Collaboration Visit Note   01/20/2023 Name: Sara Moreno MRN: [id number] DOB: May 30, 1946  Sara Moreno is a 77 y.o. year old female who sees Kuneff, Renee A, DO for primary care. I  collaborated with LCSW Jenel Lucks to discuss patients desire to receive meals on wheels. Referral placed to [employer] on Wheels via [employer].  What matters to the patients health and wellness today?  LCSW reports the patient desired home delivered meals.    SDOH assessments and interventions completed:  No     Care Coordination Interventions:  Yes, provided   Interventions Today    Flowsheet Row Most Recent Value  Chronic Disease   Chronic disease during today's visit Hypertension (HTN), Diabetes  General Interventions   General Interventions Discussed/Reviewed Communication with, Tax adviser made to [employer] [place] for meals on wheels]  Communication with Social Work       Follow up plan: Referral made to [employer]. SW will continue to follow to assist with care coordination needs.    Encounter Outcome:  Pt. Visit Completed   Bevelyn Ngo, BSW, CDP Social Worker, Certified Dementia Practitioner [facility] Care Management  Care Coordination [phone]

## 2023-01-20 NOTE — Patient Outreach (Signed)
  Care Coordination   Initial Visit Note   01/20/2023 Name: MICKAYLA HORTIN MRN: [id number] DOB: [birth year]  BANDY TORRENS is a 77 y.o. year old female who sees Kuneff, Renee A, DO for primary care. I spoke with  [name] by phone today.  What matters to the patients health and wellness today?  Supportive Resources and symptom management    Goals Addressed             This Visit's Progress    Obtain Supportive Resources   On track    Activities and task to complete in order to accomplish goals.   Keep all upcoming appointments discussed today Continue with compliance of taking medication prescribed by Doctor Implement healthy coping skills discussed to assist with management of symptoms Continue working with [facility] care team to assist with goals identified         SDOH assessments and interventions completed:  No     Care Coordination Interventions:  Yes, provided  Interventions Today    Flowsheet Row Most Recent Value  Chronic Disease   Chronic disease during today's visit Hypertension (HTN), Diabetes  General Interventions   General Interventions Discussed/Reviewed General Interventions Discussed, Doctor Visits, Community Resources  Doctor Visits Discussed/Reviewed Doctor Visits Discussed  Mental Health Interventions   Mental Health Discussed/Reviewed Mental Health Reviewed, Coping Strategies  Nutrition Interventions   Nutrition Discussed/Reviewed Nutrition Reviewed  Pharmacy Interventions   Pharmacy Dicussed/Reviewed Pharmacy Topics Discussed  Safety Interventions   Safety Discussed/Reviewed Safety Discussed       Follow up plan: Follow up call scheduled for 2-4 weeks    Encounter Outcome:  Pt. Visit Completed   Jenel Lucks, MSW, LCSW [facility] Care Management [facility]  [facility] [email or web address] Phone [phone] 5:49 PM

## 2023-01-20 NOTE — Patient Instructions (Signed)
Visit Information  Thank you for taking time to visit with me today. Please don't hesitate to contact me if I can be of assistance to you.   Following are the goals we discussed today:   Goals Addressed             This Visit's Progress    Obtain Supportive Resources   On track    Activities and task to complete in order to accomplish goals.   Keep all upcoming appointments discussed today Continue with compliance of taking medication prescribed by Doctor Implement healthy coping skills discussed to assist with management of symptoms Continue working with [facility] care team to assist with goals identified         Our next appointment is by telephone on 7/2 at 10:30 AM  Please call the care guide team at [phone] if you need to cancel or reschedule your appointment.   If you are experiencing a Mental Health or Behavioral Health Crisis or need someone to talk to, please call the Suicide and Crisis Lifeline: 988 call 911   Patient verbalizes understanding of instructions and care plan provided today and agrees to view in MyChart. Active MyChart status and patient understanding of how to access instructions and care plan via MyChart confirmed with patient.     Jenel Lucks, MSW, LCSW [facility] Care Management [place]  [facility] [email or web address] Phone [phone] 5:50 PM

## 2023-01-25 ENCOUNTER — Telehealth: Payer: Self-pay

## 2023-01-25 ENCOUNTER — Ambulatory Visit: Payer: Self-pay

## 2023-01-25 NOTE — Patient Outreach (Signed)
  Care Coordination   Follow Up Visit Note   01/25/2023 Name: Sara Moreno MRN: [id number] DOB: [birth year]  Sara Moreno is a 77 y.o. year old female who sees Kuneff, Renee A, DO for primary care. I spoke with  [name] by phone today.  What matters to the patients health and wellness today?  Patient would like to receive meals on wheels    Goals Addressed             This Visit's Progress    COMPLETED: Care Coordination Activities       Care Coordination Interventions: Educated the patient on the meals on wheels benefit advising a referral has been placed to [employer] [place] Advised the patient she will be contacted directly by this resource to determine eligibility. The patient is aware there is a wait list Confirmed the patients son and daughter in law are available to assist with meals until patient begins receiving meals on wheels Collaboration with LCSW Jenel Lucks to advise of this SW plan to sign off. LCSW will remain engaged with the patient        SDOH assessments and interventions completed:  No     Care Coordination Interventions:  Yes, provided   Interventions Today    Flowsheet Row Most Recent Value  Chronic Disease   Chronic disease during today's visit Diabetes, Hypertension (HTN)  General Interventions   General Interventions Discussed/Reviewed General Interventions Reviewed, [employer] on wheels]        Follow up plan: No further intervention required. The patient will remain engaged with LCSW Jenel Lucks.    Encounter Outcome:  Pt. Visit Completed   Bevelyn Ngo, BSW, CDP Social Worker, Certified Dementia Practitioner [facility] Care Management  Care Coordination [phone]

## 2023-01-25 NOTE — Patient Outreach (Signed)
  Care Coordination   01/25/2023 Name: Sara Moreno MRN: [id number] DOB: [birth year]   Care Coordination Outreach Attempts:  SW placed an unsuccessful outbound call to the patient to follow up on care coordination needs related to prepared meals. SW left a HIPAA compliant voice message requesting a return call.  Follow Up Plan:  Additional outreach attempts will be made to offer the patient care coordination information and services.   Encounter Outcome:  No Answer   Care Coordination Interventions:  No, not indicated    Bevelyn Ngo, BSW, CDP Social Worker, Certified Dementia Practitioner [facility] Care Management  Care Coordination [phone]

## 2023-01-25 NOTE — Patient Instructions (Signed)
Visit Information  Thank you for taking time to visit with me today. Please don't hesitate to contact me if I can be of assistance to you.   Following are the goals we discussed today:  - Engage with Publishing rights manager regarding meals on wheels - they will contact you   Your next appointment is by telephone on 7/2 at 10:30 with LCSW Jenel Lucks  Please call the care guide team at [phone] if you need to cancel or reschedule your appointment.   If you are experiencing a Mental Health or Behavioral Health Crisis or need someone to talk to, please go to [facility] [address] [place] [phone]) call 911  Patient verbalizes understanding of instructions and care plan provided today and agrees to view in MyChart. Active MyChart status and patient understanding of how to access instructions and care plan via MyChart confirmed with patient.     Bevelyn Ngo, BSW, CDP Social Worker, Certified Dementia Practitioner [facility] Care Management  Care Coordination [phone]

## 2023-01-26 ENCOUNTER — Encounter: Payer: Self-pay | Admitting: Family Medicine

## 2023-01-26 ENCOUNTER — Ambulatory Visit (INDEPENDENT_AMBULATORY_CARE_PROVIDER_SITE_OTHER): Payer: Medicare Other | Admitting: Family Medicine

## 2023-01-26 VITALS — BP 123/80 | HR 65 | Wt 128.4 lb

## 2023-01-26 DIAGNOSIS — H401113 Primary open-angle glaucoma, right eye, severe stage: Secondary | ICD-10-CM | POA: Diagnosis not present

## 2023-01-26 DIAGNOSIS — Z789 Other specified health status: Secondary | ICD-10-CM | POA: Diagnosis not present

## 2023-01-26 DIAGNOSIS — H548 Legal blindness, as defined in USA: Secondary | ICD-10-CM | POA: Diagnosis not present

## 2023-01-26 NOTE — Patient Instructions (Signed)
For more information email [facility] In-home aide program consultant or call [phone].    Website: [email or web address]:

## 2023-01-26 NOTE — Progress Notes (Unsigned)
Patient ID: Sara Moreno, female  DOB: February 24, 1946, 77 y.o.   MRN: [id number] Patient Care Team    Relationship Specialty Notifications Start End  Natalia Leatherwood, DO PCP - General Family Medicine  09/08/22   Bond, Doran Stabler, MD Referring Physician Ophthalmology  09/08/22   Bridgett Larsson, LCSW Social Worker Licensed Clinical Social Worker  01/17/23    Comment: [phone]    Chief Complaint  Patient presents with   legally blind    Subjective:  Sara Moreno is a 77 y.o.  female present for condition management appointment All past medical history, surgical history, allergies, family history, immunizations, medications and social history were updated in the electronic medical record today. All recent labs, ED visits and hospitalizations within the last year were reviewed.   Type 2 diabetes mellitus with hyperlipidemia (HCC) Pt reports compliance with metformin 500 twice daily and atorvastatin 80 mg nightly.  Patient denies dizziness, hyperglycemic or hypoglycemic events. Patient denies numbness, tingling in the extremities or nonhealing wounds of feet.  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 Patient denies chest pain, shortness of breath, dizziness or lower extremity edema.  Pt takes a daily baby ASA. Pt is  prescribed statin.  Age-related osteoporosis without current pathological fracture Patient reports compliance with the use of Fosamax 70 mg once weekly for her osteoporosis.  Last DEXA 2021.      12/21/2022    2:08 PM 11/03/2022    3:18 PM 09/22/2022   11:23 AM 09/08/2022    1:27 PM  Depression screen PHQ 2/9  Decreased Interest 0 0 0 0  Down, Depressed, Hopeless 0 0 1 0  PHQ - 2 Score 0 0 1 0  Altered sleeping 0 0  0   Tired, decreased energy 0 0  0  Change in appetite 0 0  0  Feeling bad or failure about yourself  0 0  1  Trouble concentrating 0 0  0  Moving slowly or fidgety/restless 0 0  0  Suicidal thoughts 0 0  0  PHQ-9 Score 0 0  1  Difficult doing work/chores    Very difficult      09/08/2022    1:27 PM  GAD 7 : Generalized Anxiety Score  Nervous, Anxious, on Edge 1  Control/stop worrying 2  Worry too much - different things 2  Trouble relaxing 0  Restless 0  Easily annoyed or irritable 0  Afraid - awful might happen 0  Total GAD 7 Score 5  Anxiety Difficulty Not difficult at all          12/21/2022    2:08 PM 11/03/2022    3:17 PM 09/22/2022   11:26 AM 09/08/2022    1:26 PM  Fall Risk   Falls in the past year? 0 0 0 0  Number falls in past yr: 0  0 0  Injury with Fall? 0  0 0  Risk for fall due to : No Fall Risks  Impaired vision Impaired balance/gait;Impaired vision  Follow up Falls evaluation completed  Falls prevention discussed Falls evaluation completed     Immunization History  Administered Date(s) Administered   Fluad Quad(high Dose 65+) 05/30/2020, 05/02/2022   PFIZER(Purple Top)SARS-COV-2 Vaccination 11/08/2019,  11/29/2019, 05/27/2020   PNEUMOCOCCAL CONJUGATE-20 05/04/2022   RSV,unspecified 05/04/2022   Unspecified SARS-COV-2 Vaccination 05/03/2022   Zoster Recombinat (Shingrix) 06/11/2021, 07/13/2021    No results found.  Past Medical History:  Diagnosis Date   Arthritis    Blind    Chicken pox    Colon polyp    Diabetes mellitus without complication (HCC)    Glaucoma    Heart murmur    Hypercholesterolemia    Hypertension    Ingrowing toenail 12/02/2021   No Known Allergies  Past Surgical History:  Procedure Laterality Date   CATARACT EXTRACTION     CESAREAN SECTION  1971   COLONOSCOPY  2022   TONSILLECTOMY  1955   Family History  Problem Relation Age of Onset   Hypertension Mother    Arthritis Mother    Heart attack Mother    Heart disease  Mother    Hypertension Father    Arthritis Father    Hypertension Sister    Arthritis Sister    Hearing loss Sister    Hypertension Son    Diabetes Son    Asthma Son    Depression Son    Social History   Social History Narrative   Marital status/children/pets: Widow.  Widow   Education/employment: Retired, high school education   Safety:      -smoke alarm in the home:Yes     - wears seatbelt: Yes     - Feels safe in their relationships: Yes       Allergies as of 01/26/2023   No Known Allergies      Medication List        Accurate as of January 26, 2023 12:58 PM. If you have any questions, ask your nurse or doctor.          alendronate 70 MG tablet Commonly known as: FOSAMAX Take 1 tablet (70 mg total) by mouth once a week. Empty stomach with a full glass of water   amLODipine 10 MG tablet Commonly known as: NORVASC Take 1 tablet (10 mg total) by mouth daily.   aspirin 81 MG chewable tablet Chew by mouth daily.   atorvastatin 80 MG tablet Commonly known as: LIPITOR Take 1 tablet (80 mg total) by mouth daily.   brimonidine 0.2 % ophthalmic solution Commonly known as: ALPHAGAN Apply to eye.   Dexcom G7 Receiver Hardie Pulley See admin instructions.   Dexcom G7 Sensor Misc 1 AS DIRECTED   dorzolamide-timolol 2-0.5 % ophthalmic solution Commonly known as: COSOPT SMARTSIG:In Eye(s)   hydrochlorothiazide 25 MG tablet Commonly known as: HYDRODIURIL Take 1 tablet (25 mg total) by mouth daily.   metFORMIN 500 MG tablet Commonly known as: GLUCOPHAGE Take 1 tablet (500 mg total) by mouth 2 (two) times daily with a meal.   metoprolol 200 MG 24 hr tablet Commonly known as: TOPROL-XL Take 1 tablet (200 mg total) by mouth daily.   mupirocin ointment 2 % Commonly known as: BACTROBAN Apply 1 Application topically 2 (two) times daily. Apply BID to large toenail folds x 2 weeks   potassium chloride SA 20 MEQ tablet Commonly known as: KLOR-CON M Take 1 tablet (20  mEq total) by mouth 2 (two) times daily.   prednisoLONE acetate 1 % ophthalmic suspension Commonly known as: PRED FORTE Place 1 drop into the right eye 3 (three) times daily.        All past medical history, surgical history, allergies, family history, immunizations andmedications were updated in the EMR today and reviewed under  the history and medication portions of their EMR.      No results found.   ROS 14 pt review of systems performed and negative (unless mentioned in an HPI)  Objective: There were no vitals taken for this visit. Physical Exam Vitals and nursing note reviewed.  Constitutional:      General: She is not in acute distress.    Appearance: Normal appearance. She is not ill-appearing, toxic-appearing or diaphoretic.  HENT:     Head: Normocephalic and atraumatic.  Eyes:     General: No scleral icterus.       Right eye: No discharge.        Left eye: No discharge.     Extraocular Movements: Extraocular movements intact.     Conjunctiva/sclera: Conjunctivae normal.     Pupils: Pupils are equal, round, and reactive to light.  Cardiovascular:     Rate and Rhythm: Normal rate and regular rhythm.     Heart sounds: No murmur heard. Pulmonary:     Effort: Pulmonary effort is normal. No respiratory distress.     Breath sounds: Normal breath sounds. No wheezing, rhonchi or rales.  Musculoskeletal:     Right lower leg: No edema.     Left lower leg: No edema.  Skin:    General: Skin is warm.     Findings: No rash.  Neurological:     Mental Status: She is alert and oriented to person, place, and time. Mental status is at baseline.     Motor: No weakness.     Gait: Gait normal.  Psychiatric:        Mood and Affect: Mood normal.        Behavior: Behavior normal.        Thought Content: Thought content normal.        Judgment: Judgment normal.     Assessment/plan: IVYANNA SIBERT is a 77 y.o. female present for est care Type 2 diabetes mellitus with  hyperlipidemia (HCC) Stable Continue metformin 500 mg twice daily PNA series: completed 2023 Flu shot: UTD 2023 (recommneded yearly) Foot exam: 09/08/2022- completed Eye exam: completed 12/11/2022, Dr. Lottie Dawson Urine microalbumin UTD 09/2022 A1c: 6.8 > 6.3 today  Primary hypertension Stable Continue amlodipine 10 mg daily Continue metoprolol succinate 200 mg daily Continue HCTZ 25 mg daily Continue K-Dur 20 daily Continue ASA 81 mg Continue atorvastatin 80 mg Labs up-to-date 09/2022  Age-related osteoporosis without current pathological fracture/Hypervitaminosis D Patient reports she has been prescribed Fosamax 70 mg once weekly for her osteoporosis.  Last DEXA 2021. 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  HH order placed for [facility] aide assistance 2-3 x weekly  No follow-ups on file.  No orders of the defined types were placed in this encounter.  No orders of the defined types were placed in this encounter.  Referral Orders  No referral(s) requested today     Note is dictated utilizing voice recognition software. Although note has been proof read prior to signing, occasional typographical errors still can be missed. If any questions arise, please do not hesitate to call for verification.  Electronically signed by: Felix Pacini, DO [facility]

## 2023-01-31 ENCOUNTER — Encounter: Payer: Self-pay | Admitting: Family Medicine

## 2023-02-01 ENCOUNTER — Encounter: Payer: Self-pay | Admitting: Licensed Clinical Social Worker

## 2023-02-02 ENCOUNTER — Telehealth: Payer: Self-pay | Admitting: Licensed Clinical Social Worker

## 2023-02-02 NOTE — Patient Outreach (Signed)
  Care Coordination   02/01/2023 Name: Sara Moreno MRN: [id number] DOB: Sep 27, 1945   Care Coordination Outreach Attempts:  An unsuccessful telephone outreach was attempted for a scheduled appointment today.  Follow Up Plan:  Additional outreach attempts will be made to offer the patient care coordination information and services.   Encounter Outcome:  No Answer   Care Coordination Interventions:  No, not indicated    Jenel Lucks, MSW, LCSW [facility] Care Management [place]  [facility] [email or web address] Phone [phone] 10:33 AM

## 2023-02-02 NOTE — Patient Outreach (Signed)
  Care Coordination   02/02/2023 Name: Sara Moreno MRN: [id number] DOB: 1945-11-29   Care Coordination Outreach Attempts:  An unsuccessful telephone outreach was attempted today to offer the patient information about available care coordination services.  Follow Up Plan:  Additional outreach attempts will be made to offer the patient care coordination information and services.   Encounter Outcome:  No Answer   Care Coordination Interventions:  No, not indicated    Jenel Lucks, MSW, LCSW [facility] Care Management [place]  [facility] [email or web address] Phone [phone] 10:32 AM

## 2023-02-03 NOTE — Patient Outreach (Signed)
  Care Coordination   Follow Up Visit Note   02/02/2023 Name: LAXMI MAZZARA MRN: [id number] DOB: 1945/12/20  DEYLANI PREDDY is a 77 y.o. year old female who sees Kuneff, Renee A, DO for primary care. I spoke with  [name] by phone today.  What matters to the patients health and wellness today?  Community Resources and Symptom Management    Goals Addressed             This Visit's Progress    Obtain Supportive Resources   On track    Activities and task to complete in order to accomplish goals.   Keep all upcoming appointments discussed today Continue with compliance of taking medication prescribed by Doctor Implement healthy coping skills discussed to assist with management of symptoms Continue working with [facility] care team to assist with goals identified         SDOH assessments and interventions completed:  No     Care Coordination Interventions:  Yes, provided  Interventions Today    Flowsheet Row Most Recent Value  Chronic Disease   Chronic disease during today's visit Diabetes, Hypertension (HTN)  General Interventions   General Interventions Discussed/Reviewed General Interventions Reviewed, [facility], Doctor Visits  Doctor Visits Discussed/Reviewed Doctor Visits Reviewed  Mental Health Interventions   Mental Health Discussed/Reviewed Mental Health Reviewed, Coping Strategies, Grief and Loss  Nutrition Interventions   Nutrition Discussed/Reviewed Nutrition Reviewed  Pharmacy Interventions   Pharmacy Dicussed/Reviewed Pharmacy Topics Reviewed       Follow up plan: Follow up call scheduled for 2-4 weeks    Encounter Outcome:  Pt. Visit Completed   Jenel Lucks, MSW, LCSW [facility] Care Management [facility]  [facility] [email or web address] Phone [phone] 1:13 AM

## 2023-02-03 NOTE — Patient Instructions (Signed)
Visit Information  Thank you for taking time to visit with me today. Please don't hesitate to contact me if I can be of assistance to you.   Following are the goals we discussed today:   Goals Addressed             This Visit's Progress    Obtain Supportive Resources   On track    Activities and task to complete in order to accomplish goals.   Keep all upcoming appointments discussed today Continue with compliance of taking medication prescribed by Doctor Implement healthy coping skills discussed to assist with management of symptoms Continue working with [facility] care team to assist with goals identified         Our next appointment is by telephone on 7/17 at 11:30 AM  Please call the care guide team at [phone] if you need to cancel or reschedule your appointment.   If you are experiencing a Mental Health or Behavioral Health Crisis or need someone to talk to, please call the Suicide and Crisis Lifeline: 988 call 911   Patient verbalizes understanding of instructions and care plan provided today and agrees to view in MyChart. Active MyChart status and patient understanding of how to access instructions and care plan via MyChart confirmed with patient.     Jenel Lucks, MSW, LCSW [facility] Care Management [place]  [facility] [email or web address] Phone [phone] 1:14 AM

## 2023-02-06 ENCOUNTER — Emergency Department (HOSPITAL_COMMUNITY)
Admission: EM | Admit: 2023-02-06 | Discharge: 2023-02-06 | Disposition: A | Discharge status: Home / Self Care | Payer: Medicare Other | Attending: Emergency Medicine | Admitting: Emergency Medicine

## 2023-02-06 ENCOUNTER — Emergency Department (HOSPITAL_COMMUNITY): Payer: Medicare Other

## 2023-02-06 ENCOUNTER — Encounter: Payer: Self-pay | Admitting: Family Medicine

## 2023-02-06 ENCOUNTER — Other Ambulatory Visit: Payer: Self-pay

## 2023-02-06 DIAGNOSIS — R55 Syncope and collapse: Secondary | ICD-10-CM | POA: Diagnosis not present

## 2023-02-06 DIAGNOSIS — Z7982 Long term (current) use of aspirin: Secondary | ICD-10-CM | POA: Insufficient documentation

## 2023-02-06 DIAGNOSIS — R42 Dizziness and giddiness: Secondary | ICD-10-CM | POA: Diagnosis not present

## 2023-02-06 DIAGNOSIS — R9089 Other abnormal findings on diagnostic imaging of central nervous system: Secondary | ICD-10-CM | POA: Diagnosis not present

## 2023-02-06 DIAGNOSIS — I6782 Cerebral ischemia: Secondary | ICD-10-CM | POA: Diagnosis not present

## 2023-02-06 LAB — URINALYSIS, W/ REFLEX TO CULTURE (INFECTION SUSPECTED)
Bacteria, UA: NONE SEEN
Bilirubin Urine: NEGATIVE
Glucose, UA: NEGATIVE mg/dL
Hgb urine dipstick: NEGATIVE
Ketones, ur: NEGATIVE mg/dL
Leukocytes,Ua: NEGATIVE
Nitrite: NEGATIVE
Protein, ur: NEGATIVE mg/dL
Specific Gravity, Urine: 1.005 (ref 1.005–1.030)
pH: 7 (ref 5.0–8.0)

## 2023-02-06 LAB — COMPREHENSIVE METABOLIC PANEL
ALT: 16 U/L (ref 0–44)
AST: 16 U/L (ref 15–41)
Albumin: 4.2 g/dL (ref 3.5–5.0)
Alkaline Phosphatase: 68 U/L (ref 38–126)
Anion gap: 9 (ref 5–15)
BUN: 19 mg/dL (ref 8–23)
CO2: 30 mmol/L (ref 22–32)
Calcium: 10.2 mg/dL (ref 8.9–10.3)
Chloride: 101 mmol/L (ref 98–111)
Creatinine, Ser: 0.83 mg/dL (ref 0.44–1.00)
GFR, Estimated: >60 mL/min (ref >60)
Glucose, Bld: 185 mg/dL — ABNORMAL HIGH (ref 70–99)
Potassium: 4.3 mmol/L (ref 3.5–5.1)
Sodium: 140 mmol/L (ref 135–145)
Total Bilirubin: 0.4 mg/dL (ref 0.3–1.2)
Total Protein: 8 g/dL (ref 6.5–8.1)

## 2023-02-06 LAB — CBC WITH DIFFERENTIAL/PLATELET
Abs Immature Granulocytes: 0.02 10*3/uL (ref 0.00–0.07)
Basophils Absolute: 0 10*3/uL (ref 0.0–0.1)
Basophils Relative: 0 %
Eosinophils Absolute: 0.2 10*3/uL (ref 0.0–0.5)
Eosinophils Relative: 2 %
HCT: 45.9 % (ref 36.0–46.0)
Hemoglobin: 14.8 g/dL (ref 12.0–15.0)
Immature Granulocytes: 0 %
Lymphocytes Relative: 15 %
Lymphs Abs: 1.2 10*3/uL (ref 0.7–4.0)
MCH: 29.1 pg (ref 26.0–34.0)
MCHC: 32.2 g/dL (ref 30.0–36.0)
MCV: 90.2 fL (ref 80.0–100.0)
Monocytes Absolute: 0.6 10*3/uL (ref 0.1–1.0)
Monocytes Relative: 7 %
Neutro Abs: 5.9 10*3/uL (ref 1.7–7.7)
Neutrophils Relative %: 76 %
Platelets: 254 10*3/uL (ref 150–400)
RBC: 5.09 MIL/uL (ref 3.87–5.11)
RDW: 13.6 % (ref 11.5–15.5)
WBC: 7.8 10*3/uL (ref 4.0–10.5)
nRBC: 0 % (ref 0.0–0.2)

## 2023-02-06 MED ORDER — MECLIZINE HCL 25 MG PO TABS: 25.0000 mg | ORAL_TABLET | Freq: Three times a day (TID) | ORAL | 0 refills | Status: DC | PRN

## 2023-02-06 MED ORDER — LORAZEPAM 0.5 MG PO TABS
0.5000 mg | ORAL_TABLET | Freq: Once | ORAL | Status: AC
  Administered 2023-02-06: 0.5 mg via ORAL
  Filled 2023-02-06: qty 1

## 2023-02-06 MED ORDER — MECLIZINE HCL 25 MG PO TABS
25.0000 mg | ORAL_TABLET | Freq: Once | ORAL | Status: AC
  Administered 2023-02-06: 25 mg via ORAL
  Filled 2023-02-06: qty 1

## 2023-02-06 NOTE — ED Provider Notes (Signed)
[place] EMERGENCY DEPARTMENT AT [facility] Provider Note   CSN: [id number] Arrival date & time: 02/06/23  1640     History  Chief Complaint  Patient presents with   Dizziness    Sara Moreno is a 77 y.o. female.  77 year old female with prior medical history as detailed below presents for evaluation.  Patient reports that this morning when she awoke she experienced unsteadiness.  She reports that she is blind.  She usually uses a cane for ambulation assistance.  She reports that this morning she awakened and noted that she felt unsteady with attempted ambulation.  This persisted throughout the day.  She denies headache, chest pain, shortness of breath, focal weakness.  She denies prior history of vertigo.  She denies prior history of CVA.  The history is provided by the patient and medical records.       Home Medications Prior to Admission medications   Medication Sig Start Date End Date Taking? Authorizing Provider  alendronate (FOSAMAX) 70 MG tablet Take 1 tablet (70 mg total) by mouth once a week. Empty stomach with a full glass of water 09/08/22   Kuneff, Renee A, DO  amLODipine (NORVASC) 10 MG tablet Take 1 tablet (10 mg total) by mouth daily. 12/21/22   Kuneff, Renee A, DO  aspirin 81 MG chewable tablet Chew by mouth daily.    [provider]  atorvastatin (LIPITOR) 80 MG tablet Take 1 tablet (80 mg total) by mouth daily. 12/21/22   Kuneff, Renee A, DO  brimonidine (ALPHAGAN) 0.2 % ophthalmic solution Apply to eye. 12/11/22   [provider]  Continuous Blood Gluc Receiver (DEXCOM G7 RECEIVER) DEVI See admin instructions. 09/15/22   [provider]  Continuous Glucose Sensor (DEXCOM G7 SENSOR) MISC 1 AS DIRECTED 11/29/22   [provider]  dorzolamide-timolol (COSOPT) 2-0.5 % ophthalmic solution SMARTSIG:In Eye(s)    [provider]  hydrochlorothiazide (HYDRODIURIL) 25 MG tablet Take 1 tablet (25 mg total) by mouth daily.  12/21/22   Kuneff, Renee A, DO  metFORMIN (GLUCOPHAGE) 500 MG tablet Take 1 tablet (500 mg total) by mouth 2 (two) times daily with a meal. 12/21/22   Kuneff, Renee A, DO  metoprolol (TOPROL-XL) 200 MG 24 hr tablet Take 1 tablet (200 mg total) by mouth daily. 12/21/22   Kuneff, Renee A, DO  mupirocin ointment (BACTROBAN) 2 % Apply 1 Application topically 2 (two) times daily. Apply BID to large toenail folds x 2 weeks 12/21/22   Kuneff, Renee A, DO  potassium chloride SA (KLOR-CON M) 20 MEQ tablet Take 1 tablet (20 mEq total) by mouth 2 (two) times daily. 12/21/22   Kuneff, Renee A, DO  prednisoLONE acetate (PRED FORTE) 1 % ophthalmic suspension Place 1 drop into the right eye 3 (three) times daily. 07/03/22   [provider]      Allergies    Patient has no known allergies.    Review of Systems   Review of Systems  All other systems reviewed and are negative.   Physical Exam Updated Vital Signs BP 133/79   Pulse 76   Temp 98.3 F (36.8 C) (Oral)   Resp 11   SpO2 98%  Physical Exam Vitals and nursing note reviewed.  Constitutional:      General: She is not in acute distress.    Appearance: Normal appearance. She is well-developed.  HENT:     Head: Normocephalic and atraumatic.  Eyes:     Conjunctiva/sclera: Conjunctivae normal.  Pupils: Pupils are equal, round, and reactive to light.  Cardiovascular:     Rate and Rhythm: Normal rate and regular rhythm.     Heart sounds: Normal heart sounds.  Pulmonary:     Effort: Pulmonary effort is normal. No respiratory distress.     Breath sounds: Normal breath sounds.  Abdominal:     General: There is no distension.     Palpations: Abdomen is soft.     Tenderness: There is no abdominal tenderness.  Musculoskeletal:        General: No deformity. Normal range of motion.     Cervical back: Normal range of motion and neck supple.  Skin:    General: Skin is warm and dry.  Neurological:     General: No focal deficit present.      Mental Status: She is alert and oriented to person, place, and time. Mental status is at baseline.     Sensory: No sensory deficit.     Motor: No weakness.     ED Results / Procedures / Treatments   Labs (all labs ordered are listed, but only abnormal results are displayed) Labs Reviewed  URINALYSIS, W/ REFLEX TO CULTURE (INFECTION SUSPECTED) - Abnormal; Notable for the following components:      Result Value   Color, Urine STRAW (*)    All other components within normal limits  COMPREHENSIVE METABOLIC PANEL - Abnormal; Notable for the following components:   Glucose, Bld 185 (*)    All other components within normal limits  CBC WITH DIFFERENTIAL/PLATELET    EKG EKG Interpretation Date/Time:  Sunday February 06 2023 16:59:57 EDT Ventricular Rate:  73 PR Interval:  170 QRS Duration:  78 QT Interval:  394 QTC Calculation: 435 R Axis:   22  Text Interpretation: Sinus rhythm Abnormal R-wave progression, early transition Confirmed by Kristine Royal [id number]) on 02/06/2023 5:27:43 PM  Radiology No results found.  Procedures Procedures    Medications Ordered in ED Medications  meclizine (ANTIVERT) tablet 25 mg (has no administration in time range)    ED Course/ Medical Decision Making/ A&P                             Medical Decision Making Amount and/or Complexity of Data Reviewed Labs: ordered. Radiology: ordered.  Risk Prescription drug management.    Medical Screen Complete  This patient presented to the ED with complaint of vertigo.  This complaint involves an extensive number of treatment options. The initial differential diagnosis includes, but is not limited to, vertigo, metabolic abnormality, infection, etc.  This presentation is: Acute, Self-Limited, Previously Undiagnosed, Uncertain Prognosis, Complicated, Systemic Symptoms, and Threat to Life/Bodily Function  Patient complains of acute onset dizziness and vertiginous symptoms.  Patient noticed the  symptoms when she awoke this morning.  Patient uses a cane at baseline.  She reports increased unsteadiness with ambulation despite use of support cane.  Workup is without significant acute pathology identified.  Notably, MRI brain without evidence of CVA or other acute process.  Other screening labs including UA are without significant abnormality.  Patient is feeling improved.  She desires discharge home.  She and her son understand need for close outpatient follow-up.  Strict return precautions given and understood.      Additional history obtained: External records from outside sources obtained and reviewed including prior ED visits and prior Inpatient records.    Lab Tests:  I ordered and personally interpreted labs.  Imaging Studies ordered:  I ordered imaging studies including MRI brain I independently visualized and interpreted obtained imaging which showed NAD I agree with the radiologist interpretation.   Cardiac Monitoring:  The patient was maintained on a cardiac monitor.  I personally viewed and interpreted the cardiac monitor which showed an underlying rhythm of: NSR   Medicines ordered:  I ordered medication including meclizine, Ativan for vertigo Reevaluation of the patient after these medicines showed that the patient: improved   Problem List / ED Course:  Vertigo   Reevaluation:  After the interventions noted above, I reevaluated the patient and found that they have: improved   Disposition:  After consideration of the diagnostic results and the patients response to treatment, I feel that the patent would benefit from close outpatient follow-up.          Final Clinical Impression(s) / ED Diagnoses Final diagnoses:  Vertigo    Rx / DC Orders ED Discharge Orders          Ordered    Ambulatory referral to Social Work        02/06/23 2109    meclizine (ANTIVERT) 25 MG tablet  3 times daily PRN        02/06/23 2110               Wynetta Fines, MD 02/06/23 2112

## 2023-02-06 NOTE — ED Triage Notes (Signed)
Pt reports new onset of dizziness today with sensation of being pulled down (when standing) and bed spinning (when laying). Pt describes recent swings in BGL from 60s to 170s. Pt is blind and relates normally getting around fine with a cane. VSS.

## 2023-02-06 NOTE — Discharge Instructions (Signed)
Return for any problem.  ?

## 2023-02-09 ENCOUNTER — Other Ambulatory Visit: Payer: Medicare Other

## 2023-02-09 NOTE — Patient Outreach (Signed)
Medicaid Managed Care Social Work Note  02/09/2023 Name:  Sara Moreno MRN:  [id number] DOB:  04/05/1946  Sara Moreno is an 77 y.o. year old female who is a primary patient of Kuneff, Renee A, DO.  The Medicaid Managed Care Coordination team was consulted for assistance with:  Community Resources   Sara Moreno was given information about Medicaid Managed Care Coordination team services today. Sara Moreno Patient agreed to services and verbal consent obtained.  Engaged with patient  for by telephone forinitial visit in response to referral for case management and/or care coordination services.   Assessments/Interventions:  Review of past medical history, allergies, medications, health status, including review of consultants reports, laboratory and other test data, was performed as part of comprehensive evaluation and provision of chronic care management services.  SDOH: (Social Determinant of Health) assessments and interventions performed: SDOH Interventions    Flowsheet Row Care Coordination from 01/17/2023 in [facility] Care Coordination Clinical Support from 09/22/2022 in [facility] [place]  SDOH Interventions    Food Insecurity Interventions Intervention Not Indicated Intervention Not Indicated  Housing Interventions Intervention Not Indicated Intervention Not Indicated  Transportation Interventions Intervention Not Indicated Intervention Not Indicated  Utilities Interventions Intervention Not Indicated Intervention Not Indicated  Financial Strain Interventions -- Intervention Not Indicated  Physical Activity Interventions -- Intervention Not Indicated  Stress Interventions -- Intervention Not Indicated  Social Connections Interventions -- Intervention Not Indicated     BSW completed a telephone outreach with patient. 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 She does need assistance in the home and is looking to move. Patient states it  is okay if a referral is completed for services for the blind in [place]. BSW contacted social worker Sara Moreno with [employer] DSS and left a voicemail for patient.   Advanced Directives Status:  Not addressed in this encounter.  Care Plan                 No Known Allergies  Medications Reviewed Today     Reviewed by Natalia Leatherwood, DO (Physician) on 01/31/23 at 1610  Med List Status: <None>   Medication Order Taking? Sig Documenting Provider Last Dose Status Informant  alendronate (FOSAMAX) 70 MG tablet [id number] Yes Take 1 tablet (70 mg total) by mouth once a week. Empty stomach with a full glass of water Kuneff, Renee A, DO Taking Active   amLODipine (NORVASC) 10 MG tablet [id number] Yes Take 1 tablet (10 mg total) by mouth daily. Kuneff, Renee A, DO Taking Active   aspirin 81 MG chewable tablet [id number] Yes Chew by mouth daily. [provider] Taking Active   atorvastatin (LIPITOR) 80 MG tablet [id number] Yes Take 1 tablet (80 mg total) by mouth daily. Kuneff, Renee A, DO Taking Active   brimonidine (ALPHAGAN) 0.2 % ophthalmic solution [id number] Yes Apply to eye. [provider] Taking Active   Continuous Blood Gluc Receiver Vira Agar G7 [place]) DEVI [id number] Yes See admin instructions. [provider] Taking Active   Continuous Glucose Sensor (DEXCOM G7 SENSOR) [place] [id number] Yes 1 AS DIRECTED [provider] Taking Active   dorzolamide-timolol (COSOPT) 2-0.5 % ophthalmic solution [id number] Yes SMARTSIG:In Eye(s) [provider] Taking Active   hydrochlorothiazide (HYDRODIURIL) 25 MG tablet [id number] Yes Take 1 tablet (25 mg total) by mouth daily. Kuneff, Renee A, DO Taking Active   metFORMIN (GLUCOPHAGE) 500 MG tablet [id number] Yes Take 1 tablet (500 mg total)  by mouth 2 (two) times daily with a meal. Kuneff, Renee A, DO Taking Active   metoprolol (TOPROL-XL) 200 MG 24 hr tablet [id number] Yes Take 1 tablet (200 mg total) by mouth  daily. Kuneff, Renee A, DO Taking Active   mupirocin ointment (BACTROBAN) 2 % [id number] Yes Apply 1 Application topically 2 (two) times daily. Apply BID to large toenail folds x 2 weeks Kuneff, Renee A, DO Taking Active   potassium chloride SA (KLOR-CON M) 20 MEQ tablet [id number] Yes Take 1 tablet (20 mEq total) by mouth 2 (two) times daily. Kuneff, Renee A, DO Taking Active   prednisoLONE acetate (PRED FORTE) 1 % ophthalmic suspension [id number] Yes Place 1 drop into the right eye 3 (three) times daily. 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   Follow up:  Patient agrees to Care Plan and Follow-up.  Plan: The Managed Medicaid care management team will reach out to the patient again over the next 10 days.  Date/time of next scheduled Social Work care management/care coordination outreach:  02/23/23  Gus Puma, Kenard Gower, [facility]  Managed [facility] Social Worker [phone]

## 2023-02-09 NOTE — Patient Instructions (Signed)
Visit Information  The Patient                                              was given information about Medicaid Managed Care team care coordination services and consented to engagement with the [facility] Managed Care team.   Social Worker will follow up in 10 days.   Abelino Derrick, MHA [facility]  Managed [facility] Social Worker [phone]

## 2023-02-16 ENCOUNTER — Ambulatory Visit: Payer: Self-pay | Admitting: Licensed Clinical Social Worker

## 2023-02-17 NOTE — Patient Instructions (Signed)
Visit Information  Thank you for taking time to visit with me today. Please don't hesitate to contact me if I can be of assistance to you.   Following are the goals we discussed today:   Goals Addressed             This Visit's Progress    Obtain Supportive Resources   On track    Activities and task to complete in order to accomplish goals.   Keep all upcoming appointments discussed today Continue with compliance of taking medication prescribed by Doctor Implement healthy coping skills discussed to assist with management of symptoms Continue working with [facility] care team to assist with goals identified         Please call the care guide team at [phone] if you need to cancel or reschedule your appointment.   If you are experiencing a Mental Health or Behavioral Health Crisis or need someone to talk to, please call the Suicide and Crisis Lifeline: 988 call 911   Patient verbalizes understanding of instructions and care plan provided today and agrees to view in MyChart. Active MyChart status and patient understanding of how to access instructions and care plan via MyChart confirmed with patient.     Jenel Lucks, MSW, LCSW [facility] Care Management [place]  [facility] [email or web address] Phone [phone] 5:58 PM

## 2023-02-17 NOTE — Patient Outreach (Signed)
  Care Coordination   Follow Up Visit Note   02/16/2023 Name: Sara Moreno MRN: [id number] DOB: 09-16-1945  Sara Moreno is a 77 y.o. year old female who sees Kuneff, Renee A, DO for primary care. I spoke with  Sara Moreno by phone today.  What matters to the patients health and wellness today?  Levels of Care/Community Resources    Goals Addressed             This Visit's Progress    Obtain Supportive Resources   On track    Activities and task to complete in order to accomplish goals.   Keep all upcoming appointments discussed today Continue with compliance of taking medication prescribed by Doctor Implement healthy coping skills discussed to assist with management of symptoms Continue working with [facility] care team to assist with goals identified         SDOH assessments and interventions completed:  No     Care Coordination Interventions:  Yes, provided  Interventions Today    Flowsheet Row Most Recent Value  Chronic Disease   Chronic disease during today's visit Hypertension (HTN), Diabetes  General Interventions   General Interventions Discussed/Reviewed General Interventions Reviewed, Doctor Visits, Community Resources, Level of Care  Doctor Visits Discussed/Reviewed Doctor Visits Reviewed  Level of Care Assisted Living, Skilled Nursing Facility, Adult Daycare  Mental Health Interventions   Mental Health Discussed/Reviewed Mental Health Reviewed, Coping Strategies  Nutrition Interventions   Nutrition Discussed/Reviewed Nutrition Reviewed  Pharmacy Interventions   Pharmacy Dicussed/Reviewed Pharmacy Topics Reviewed, Medication Adherence  Safety Interventions   Safety Discussed/Reviewed Safety Reviewed, Fall Risk       Follow up plan: Follow up call scheduled for 1-2 weeks    Encounter Outcome:  Pt. Visit Completed   Jenel Lucks, MSW, LCSW [facility] Care Management [facility]  [facility] [email or web address] Phone [phone] 5:58  PM

## 2023-02-21 NOTE — Patient Outreach (Signed)
  Care Coordination   Documentation  Visit Note   02/21/2023 Name: Sara Moreno MRN: [id number] DOB: 13-May-1946  SADAF PRZYBYSZ is a 77 y.o. year old female who sees Kuneff, Renee A, DO for primary care. I reviewed the NCCARE360 dashboard to note patients referral to [facility] has not yet been acted on. Voice message left for [facility], Director of Nutrition to follow up on the status of patients referral.  What matters to the patients health and wellness today?  SW did not speak with the patient today.   SDOH assessments and interventions completed:  No     Care Coordination Interventions:  Yes, provided   Interventions Today    Flowsheet Row Most Recent Value  Chronic Disease   Chronic disease during today's visit Other  [Interest in Mobile Meals]  General Interventions   General Interventions Discussed/Reviewed Community Resources        Follow up plan:  SW will continue to follow until patients referral is acknowledged via [id number] platform.    Encounter Outcome:  Pt. Visit Completed   Bevelyn Ngo, BSW, CDP Social Worker, Certified Dementia Practitioner [facility] Care Management  Care Coordination [phone]

## 2023-02-23 ENCOUNTER — Other Ambulatory Visit: Payer: Medicare Other

## 2023-02-23 NOTE — Patient Instructions (Signed)
Visit Information  The Patient                                              was given information about Medicaid Managed Care team care coordination services and consented to engagement with the [facility] Managed Care team.   Social Worker will follow up on 03/09/23.   Abelino Derrick, MHA [facility]  Managed [employer] Social Worker [phone]

## 2023-02-23 NOTE — Patient Outreach (Signed)
  Medicaid Managed Care Social Work Note  02/23/2023 Name:  Sara Moreno MRN:  [id number] DOB:  [birth year]  Sara Moreno is an 77 y.o. year old female who is a primary patient of Kuneff, Renee A, DO.  The [facility] Managed Care Coordination team was consulted for assistance with:   services for the blind  Ms. Dimalanta was given information about Medicaid Managed Care Coordination team services today. [name] Patient agreed to services and verbal consent obtained.  Engaged with patient  for by telephone forfollow up visit in response to referral for case management and/or care coordination services.   Assessments/Interventions:  Review of past medical history, allergies, medications, health status, including review of consultants reports, laboratory and other test data, was performed as part of comprehensive evaluation and provision of chronic care management services.  SDOH: (Social Determinant of Health) assessments and interventions performed: SDOH Interventions    Flowsheet Row Care Coordination from 01/17/2023 in [facility] Care Coordination Clinical Support from 09/22/2022 in [facility] [place]  SDOH Interventions    Food Insecurity Interventions Intervention Not Indicated Intervention Not Indicated  Housing Interventions Intervention Not Indicated Intervention Not Indicated  Transportation Interventions Intervention Not Indicated Intervention Not Indicated  Utilities Interventions Intervention Not Indicated Intervention Not Indicated  Financial Strain Interventions -- Intervention Not Indicated  Physical Activity Interventions -- Intervention Not Indicated  Stress Interventions -- Intervention Not Indicated  Social Connections Interventions -- Intervention Not Indicated     BSW completed a telephone outreach with patient. She stated she thought the appointment was in person. Patient stated she has not heard from [place] with Services for the  Blind. BSW did not receive a call back from [place]. BSW sent an email to [place] and will update patient when a response is sent. No other resources are needed at this time.  Advanced Directives Status:  Not addressed in this encounter.  Care Plan                 No Known Allergies  Medications Reviewed Today   Medications were not reviewed in this encounter     Patient Active Problem List   Diagnosis Date Noted   Hypercalcemia 11/03/2022   Perioral dermatitis 11/03/2022   Type 2 diabetes mellitus with hyperlipidemia (HCC) 09/08/2022   Arthritis 09/08/2022   Hypertension 09/08/2022   B12 deficiency 09/08/2022   Bunion, right foot 12/02/2021   Hammertoe of right foot 12/02/2021   Age-related osteoporosis without current pathological fracture 10/23/2019   Diabetic retinopathy (HCC) 10/23/2019   Primary open angle glaucoma of right eye, severe stage 02/27/2019   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   Follow up:  Patient agrees to Care Plan and Follow-up.  Plan: The Managed Medicaid care management team will reach out to the patient again over the next 10 days.  Date/time of next scheduled Social Work care management/care coordination outreach:  03/09/23 Gus Puma, Kenard Gower, [facility]  Managed [facility] Social Worker [phone]

## 2023-02-25 NOTE — Patient Outreach (Signed)
  Care Coordination   Documentation  Note   02/25/2023 Name: Sara Moreno MRN: [id number] DOB: 02-20-1946  Sara Moreno is a 77 y.o. year old female who sees Kuneff, Renee A, DO for primary care. I collaborated with Publishing rights manager to follow up on the status of patients meals on wheels referral which was placed via NCCARE360 platform as it has not yet been acted on.  What matters to the patients health and wellness today?  SW did not speak with the patient today.   SDOH assessments and interventions completed:  No     Care Coordination Interventions:  Yes, provided   Interventions Today    Flowsheet Row Most Recent Value  Chronic Disease   Chronic disease during today's visit Other  [Interest in meals on wheels]  General Interventions   General Interventions Discussed/Reviewed [employer], Communication with  [Outbound call placed to [employer] requesting confirmation patients referral to meals on wheels has been received. Referral previously placed on NCCARE360 platform has yet to be acted on]        Follow up plan:  SW will follow up with [facility] [place] over the next week to confirm referral has been received.    Encounter Outcome:  Pt. Visit Completed   Bevelyn Ngo, BSW, CDP Social Worker, Certified Dementia Practitioner [facility] Care Management  Care Coordination [phone]

## 2023-03-01 NOTE — Patient Outreach (Signed)
  Care Coordination   Documentation  Note   03/01/2023 Name: PAISLEYANN DEWITT MRN: [id number] DOB: 01/04/1946  AZERIAH DOELLING is a 77 y.o. year old female who sees Kuneff, Renee A, DO for primary care. I collaborated with [facility], Nutrition Director with [employer] [facility] [place] to follow up on status of patients referral placed via NCCARE360 for meals on wheels.  SDOH assessments and interventions completed:  No     Care Coordination Interventions:  Yes, provided   Interventions Today    Flowsheet Row Most Recent Value  Chronic Disease   Chronic disease during today's visit Other  [Interest in mobile meals]  General Interventions   General Interventions Discussed/Reviewed [employer], Special educational needs teacher with  [employer] call from [employer], Interior and spatial designer of Nutrition with Publishing rights manager. Discussed Ms. [place] is actively working to obtain licensure to access referrals via [id number]. Wat list remains up to 1 year]        Follow up plan:  SW will continue to follow.    Encounter Outcome:  Pt. Visit Completed   Bevelyn Ngo, BSW, CDP Social Worker, Certified Dementia Practitioner [facility] Care Management  Care Coordination [phone]

## 2023-03-09 ENCOUNTER — Other Ambulatory Visit: Payer: Medicare Other

## 2023-03-09 NOTE — Patient Instructions (Signed)
Visit Information  The Patient                                              was given information about Medicaid Managed Care team care coordination services and consented to engagement with the Medicaid Managed Care team.   The  Patient                                              has been provided with contact information for the Managed Medicaid care management team and has been advised to call with any health related questions or concerns.   Alexis Shields, BSW, MHA [place]  Managed Medicaid Social Worker [phone]    

## 2023-03-09 NOTE — Patient Outreach (Signed)
  Medicaid Managed Care Social Work Note  03/09/2023 Name:  Sara Moreno MRN:  [id number] DOB:  1945-12-23  Sara Moreno is an 77 y.o. year old female who is a primary patient of Kuneff, Renee A, DO.  The [facility] Managed Care Coordination team was consulted for assistance with:   services for the blind  [name] was given information about Medicaid Managed Care Coordination team services today. Sara Moreno Patient agreed to services and verbal consent obtained.  Engaged with patient  for by telephone forfollow up visit in response to referral for case management and/or care coordination services.   Assessments/Interventions:  Review of past medical history, allergies, medications, health status, including review of consultants reports, laboratory and other test data, was performed as part of comprehensive evaluation and provision of chronic care management services.  SDOH: (Social Determinant of Health) assessments and interventions performed: SDOH Interventions    Flowsheet Row Care Coordination from 01/17/2023 in [facility] Care Coordination Clinical Support from 09/22/2022 in [facility] [place] [facility] [place]  SDOH Interventions    Food Insecurity Interventions Intervention Not Indicated Intervention Not Indicated  Housing Interventions Intervention Not Indicated Intervention Not Indicated  Transportation Interventions Intervention Not Indicated Intervention Not Indicated  Utilities Interventions Intervention Not Indicated Intervention Not Indicated  Financial Strain Interventions -- Intervention Not Indicated  Physical Activity Interventions -- Intervention Not Indicated  Stress Interventions -- Intervention Not Indicated  Social Connections Interventions -- Intervention Not Indicated     BSW completed a telephone outreach with patient to see if she has been in contact with services for the blind. Patient states she did hear from Sara Moreno. No other resources  are needed.  Advanced Directives Status:  Not addressed in this encounter.  Care Plan                 No Known Allergies  Medications Reviewed Today   Medications were not reviewed in this encounter     Patient Active Problem List   Diagnosis Date Noted   Hypercalcemia 11/03/2022   Perioral dermatitis 11/03/2022   Type 2 diabetes mellitus with hyperlipidemia (HCC) 09/08/2022   Arthritis 09/08/2022   Hypertension 09/08/2022   B12 deficiency 09/08/2022   Bunion, right foot 12/02/2021   Hammertoe of right foot 12/02/2021   Age-related osteoporosis without current pathological fracture 10/23/2019   Diabetic retinopathy (HCC) 10/23/2019   Primary open angle glaucoma of right eye, severe stage 02/27/2019   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   Follow up:   Plan: The  Patient has been provided with contact information for the Managed Medicaid care management team and has been advised to call with any health related questions or concerns.    Abelino Derrick, MHA [facility]  Managed [facility] Social Worker [phone]

## 2023-04-01 ENCOUNTER — Ambulatory Visit: Payer: Medicare Other | Admitting: Podiatry

## 2023-04-12 NOTE — Patient Outreach (Signed)
  Care Coordination   Documentation  Note   04/12/2023 Name: Sara Moreno MRN: [id number] DOB: [birth year]  Sara Moreno is a 77 y.o. year old female who sees Kuneff, Renee A, DO for primary care. I collaborated with Publishing rights manager to follow up on the pending referral for [facility] that was placed via NCCARE360.  What matters to the patients health and wellness today?  SW did not speak with the patient today.    SDOH assessments and interventions completed:  No     Care Coordination Interventions:  Yes, provided   Interventions Today    Flowsheet Row Most Recent Value  Chronic Disease   Chronic disease during today's visit Hypertension (HTN), Diabetes  General Interventions   General Interventions Discussed/Reviewed Teacher, English as a foreign language with [employer] [place] Interim COO, Charlane Ferretti, who accepted patients referral to MOW.]        Follow up plan: No further intervention required.   Encounter Outcome:  Patient Visit Completed   Bevelyn Ngo, Kenard Gower, CDP Social Worker, Certified Dementia Practitioner [facility] Care Management  Care Coordination [phone]

## 2023-05-13 ENCOUNTER — Encounter: Payer: Self-pay | Admitting: Family Medicine

## 2023-05-25 ENCOUNTER — Ambulatory Visit (INDEPENDENT_AMBULATORY_CARE_PROVIDER_SITE_OTHER): Payer: Medicare Other | Admitting: Family Medicine

## 2023-05-25 ENCOUNTER — Encounter: Payer: Self-pay | Admitting: Family Medicine

## 2023-05-25 VITALS — BP 112/74 | HR 60 | Temp 97.8°F | Wt 128.6 lb

## 2023-05-25 DIAGNOSIS — H548 Legal blindness, as defined in USA: Secondary | ICD-10-CM

## 2023-05-25 DIAGNOSIS — E785 Hyperlipidemia, unspecified: Secondary | ICD-10-CM | POA: Diagnosis not present

## 2023-05-25 DIAGNOSIS — E1169 Type 2 diabetes mellitus with other specified complication: Secondary | ICD-10-CM

## 2023-05-25 DIAGNOSIS — I10 Essential (primary) hypertension: Secondary | ICD-10-CM

## 2023-05-25 DIAGNOSIS — Z111 Encounter for screening for respiratory tuberculosis: Secondary | ICD-10-CM

## 2023-05-25 DIAGNOSIS — Z23 Encounter for immunization: Secondary | ICD-10-CM

## 2023-05-25 DIAGNOSIS — Z789 Other specified health status: Secondary | ICD-10-CM

## 2023-05-25 DIAGNOSIS — E559 Vitamin D deficiency, unspecified: Secondary | ICD-10-CM | POA: Diagnosis not present

## 2023-05-25 LAB — POCT GLYCOSYLATED HEMOGLOBIN (HGB A1C)
HbA1c POC (<> result, manual entry): 6.6 % (ref 4.0–5.6)
HbA1c, POC (controlled diabetic range): 6.6 % (ref 0.0–7.0)
HbA1c, POC (prediabetic range): 6.6 % — AB (ref 5.7–6.4)
Hemoglobin A1C: 6.6 % — AB (ref 4.0–5.6)

## 2023-05-25 LAB — VITAMIN D 25 HYDROXY (VIT D DEFICIENCY, FRACTURES): VITD: 85.22 ng/mL (ref 30.00–100.00)

## 2023-05-25 MED ORDER — METOPROLOL SUCCINATE ER 200 MG PO TB24: 200.0000 mg | ORAL_TABLET | Freq: Every day | ORAL | 1 refills | Status: DC

## 2023-05-25 MED ORDER — ALENDRONATE SODIUM 70 MG PO TABS: 70.0000 mg | ORAL_TABLET | ORAL | 11 refills | Status: DC

## 2023-05-25 MED ORDER — HYDROCHLOROTHIAZIDE 25 MG PO TABS: 25.0000 mg | ORAL_TABLET | Freq: Every day | ORAL | 1 refills | Status: DC

## 2023-05-25 MED ORDER — METFORMIN HCL 500 MG PO TABS: 500.0000 mg | ORAL_TABLET | Freq: Two times a day (BID) | ORAL | 1 refills | Status: DC

## 2023-05-25 MED ORDER — POTASSIUM CHLORIDE CRYS ER 20 MEQ PO TBCR: 20.0000 meq | EXTENDED_RELEASE_TABLET | Freq: Two times a day (BID) | ORAL | 1 refills | Status: DC

## 2023-05-25 MED ORDER — AMLODIPINE BESYLATE 10 MG PO TABS: 10.0000 mg | ORAL_TABLET | Freq: Every day | ORAL | 1 refills | Status: DC

## 2023-05-25 MED ORDER — ATORVASTATIN CALCIUM 80 MG PO TABS: 80.0000 mg | ORAL_TABLET | Freq: Every day | ORAL | 1 refills | Status: DC

## 2023-05-25 NOTE — Progress Notes (Signed)
Patient ID: Sara Moreno, female  DOB: 04/12/1946, 77 y.o.   MRN: [id number] Patient Care Team    Relationship Specialty Notifications Start End  Natalia Leatherwood, DO PCP - General Family Medicine  09/08/22   Bond, Doran Stabler, MD Referring Physician Ophthalmology  09/08/22   Bridgett Larsson, LCSW Social Worker Licensed Clinical Social Worker  01/17/23    Comment: [phone]    Chief Complaint  Patient presents with   Diabetes    Medicaid is requesting FL2. Pt has not had placement yet but has looked at [facility]. Waiting on forms to come for pt    Subjective: Sara Moreno is a 77 y.o.  female present for condition management appointment and discuss FL 2 form All past medical history, surgical history, allergies, family history, immunizations, medications and social history were updated in the electronic medical record today. All recent labs, ED visits and hospitalizations within the last year were reviewed.  Type 2 diabetes mellitus with hyperlipidemia (HCC) Pt reports compliance with metformin 500 twice daily and atorvastatin 80 mg nightly.  Patient denies dizziness, hyperglycemic or hypoglycemic events. Patient denies numbness, tingling in the extremities or nonhealing wounds of feet.   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 Patient denies chest pain, shortness of breath, dizziness or lower extremity edema.  Pt takes a daily baby ASA. Pt is  prescribed statin.  Age-related osteoporosis without current pathological fracture Patient reports compliance with the use of Fosamax 70 mg once weekly for her osteoporosis.  Last DEXA 2021.      05/25/2023    9:56 AM 01/26/2023    1:15 PM 12/21/2022    2:08 PM 11/03/2022    3:18 PM 09/22/2022    11:23 AM  Depression screen PHQ 2/9  Decreased Interest 2 1 0 0 0  Down, Depressed, Hopeless 2 1 0 0 1  PHQ - 2 Score 4 2 0 0 1  Altered sleeping 2 0 0 0   Tired, decreased energy 1 0 0 0   Change in appetite 0 0 0 0   Feeling bad or failure about yourself  1 0 0 0   Trouble concentrating 0 0 0 0   Moving slowly or fidgety/restless 0 0 0 0   Suicidal thoughts 0 0 0 0   PHQ-9 Score 8 2 0 0   Difficult doing work/chores Somewhat difficult Not difficult at all         05/25/2023    9:57 AM 01/26/2023    1:15 PM 09/08/2022    1:27 PM  GAD 7 : Generalized Anxiety Score  Nervous, Anxious, on Edge 1 2 1   Control/stop worrying 0 2 2  Worry too much - different things 1 2 2   Trouble relaxing 1 0 0  Restless 0 0 0  Easily annoyed or irritable 1 0 0  Afraid - awful might happen 1 0 0  Total GAD 7 Score 5 6 5   Anxiety Difficulty Somewhat difficult Not difficult at all Not difficult at all          05/25/2023    9:56 AM 01/26/2023    1:15 PM 12/21/2022    2:08 PM 11/03/2022    3:17 PM 09/22/2022  11:26 AM  Fall Risk   Falls in the past year? 0 0 0 0 0  Number falls in past yr: 0 0 0  0  Injury with Fall? 0 0 0  0  Risk for fall due to : No Fall Risks Impaired balance/gait;Impaired vision No Fall Risks  Impaired vision  Follow up Falls evaluation completed Falls evaluation completed Falls evaluation completed  Falls prevention discussed     Immunization History  Administered Date(s) Administered   Fluad Quad(high Dose 65+) 05/30/2020, 05/02/2022   Influenza-Unspecified 05/03/2023   PFIZER(Purple Top)SARS-COV-2 Vaccination 11/08/2019, 11/29/2019, 05/27/2020   PNEUMOCOCCAL CONJUGATE-20 05/04/2022   RSV,unspecified 05/04/2022   Unspecified SARS-COV-2 Vaccination 05/03/2022   Zoster Recombinant(Shingrix) 06/11/2021, 07/13/2021    No results found.  Past Medical History:  Diagnosis Date   Arthritis    Blind    Chicken pox    Colon polyp    Diabetes mellitus without  complication (HCC)    Glaucoma    Heart murmur    Hypercholesterolemia    Hypertension    Ingrowing toenail 12/02/2021   No Known Allergies  Past Surgical History:  Procedure Laterality Date   CATARACT EXTRACTION     CESAREAN SECTION  1971   COLONOSCOPY  2022   TONSILLECTOMY  1955   Family History  Problem Relation Age of Onset   Hypertension Mother    Arthritis Mother    Heart attack Mother    Heart disease Mother    Hypertension Father    Arthritis Father    Hypertension Sister    Arthritis Sister    Hearing loss Sister    Hypertension Son    Diabetes Son    Asthma Son    Depression Son    Social History   Social History Narrative   Marital status/children/pets: Widow.  Widow   Education/employment: Retired, high school education   Safety:      -smoke alarm in the home:Yes     - wears seatbelt: Yes     - Feels safe in their relationships: Yes       Allergies as of 05/25/2023   No Known Allergies      Medication List        Accurate as of May 25, 2023 12:44 PM. If you have any questions, ask your nurse or doctor.          STOP taking these medications    mupirocin ointment 2 % Commonly known as: BACTROBAN Stopped by: Felix Pacini       TAKE these medications    alendronate 70 MG tablet Commonly known as: FOSAMAX Take 1 tablet (70 mg total) by mouth once a week. Empty stomach with a full glass of water   amLODipine 10 MG tablet Commonly known as: NORVASC Take 1 tablet (10 mg total) by mouth daily.   aspirin 81 MG chewable tablet Chew by mouth daily.   atorvastatin 80 MG tablet Commonly known as: LIPITOR Take 1 tablet (80 mg total) by mouth daily.   brimonidine 0.2 % ophthalmic solution Commonly known as: ALPHAGAN Apply to eye.   Dexcom G7 Receiver Hardie Pulley See admin instructions.   Dexcom G7 Sensor Misc 1 AS DIRECTED   dorzolamide-timolol 2-0.5 % ophthalmic solution Commonly known as: COSOPT SMARTSIG:In Eye(s)    hydrochlorothiazide 25 MG tablet Commonly known as: HYDRODIURIL Take 1 tablet (25 mg total) by mouth daily.   meclizine 25 MG tablet Commonly known as: ANTIVERT Take 1 tablet (25 mg total) by mouth 3 (  three) times daily as needed for dizziness.   metFORMIN 500 MG tablet Commonly known as: GLUCOPHAGE Take 1 tablet (500 mg total) by mouth 2 (two) times daily with a meal.   metoprolol 200 MG 24 hr tablet Commonly known as: TOPROL-XL Take 1 tablet (200 mg total) by mouth daily.   potassium chloride SA 20 MEQ tablet Commonly known as: KLOR-CON M Take 1 tablet (20 mEq total) by mouth 2 (two) times daily.   prednisoLONE acetate 1 % ophthalmic suspension Commonly known as: PRED FORTE Place 1 drop into the right eye 3 (three) times daily.        All past medical history, surgical history, allergies, family history, immunizations andmedications were updated in the EMR today and reviewed under the history and medication portions of their EMR.      No results found.   ROS 14 pt review of systems performed and negative (unless mentioned in an HPI)  Objective: BP 112/74   Pulse 60   Temp 97.8 F (36.6 C)   Wt 128 lb 9.6 oz (58.3 kg)   SpO2 98%   BMI 25.97 kg/m  Physical Exam Vitals and nursing note reviewed.  Constitutional:      General: She is not in acute distress.    Appearance: Normal appearance. She is not ill-appearing, toxic-appearing or diaphoretic.  HENT:     Head: Normocephalic and atraumatic.  Eyes:     General: No scleral icterus.       Right eye: No discharge.        Left eye: No discharge.     Extraocular Movements: Extraocular movements intact.     Conjunctiva/sclera: Conjunctivae normal.     Pupils: Pupils are equal, round, and reactive to light.  Cardiovascular:     Rate and Rhythm: Normal rate and regular rhythm.     Heart sounds: No murmur heard. Pulmonary:     Effort: Pulmonary effort is normal. No respiratory distress.     Breath sounds:  Normal breath sounds. No wheezing, rhonchi or rales.  Musculoskeletal:     Right lower leg: No edema.     Left lower leg: No edema.  Skin:    General: Skin is warm.     Findings: No rash.  Neurological:     Mental Status: She is alert and oriented to person, place, and time. Mental status is at baseline.     Motor: No weakness.     Gait: Gait normal.  Psychiatric:        Mood and Affect: Mood normal.        Behavior: Behavior normal.        Thought Content: Thought content normal.        Judgment: Judgment normal.     Assessment/plan: Sara Moreno is a 77 y.o. female present for chronic condition management and FL 2 form discussion Type 2 diabetes mellitus with hyperlipidemia (HCC) Stable Continue metformin 500 mg twice daily PNA series: completed 2023 Flu shot: UTD 2024 (recommneded yearly) Foot exam: 09/08/2022- completed-has podiatry appointment tomorrow. Eye exam: completed 12/11/2022, Dr. Lottie Dawson Urine microalbumin UTD 09/2022 A1c: 6.8 > 6.3> 6.6 today  Primary hypertension Stable Continue amlodipine 10 mg daily Continue metoprolol succinate 200 mg daily Continue HCTZ 25 mg daily Continue K-Dur 20 daily Continue ASA 81 mg Continue atorvastatin 80 mg Labs up-to-date-due next visit  Age-related osteoporosis without current pathological fracture/Hypervitaminosis D Patient reports she has been prescribed Fosamax 70 mg once weekly for her osteoporosis.  Last DEXA  2021. - DG Bone Density; Future> [facility]-order still active from 12/21/2022  Hypervitaminosis-D - Vitamin D (25 hydroxy) collected today  Legally blind/FL 2/tuberculosis screening: Patient has been living with her son and daughter-in-law, but her vision has declined and she feels she needs more assistance than they can provide with their work schedules. They are looking into assisted living facilities and believe they are interested in [place].  They have applied for Medicaid and are waiting for response. They are  asking about FL 2 form, and we discussed this today that typically we receive the FL 2 form from the patient or the care facility after they are given a move in  date. -Referral to the VBCI-social work to help patient with the Medicaid process and the assisted living process. Will go ahead and collect TB screening today, since all facilities require updated TB screening before admittance. -QuantiFERON gold collected  Return in about 24 weeks (around 11/09/2023) for Routine chronic condition follow-up.  Orders Placed This Encounter  Procedures   QuantiFERON-TB Gold Plus   Vitamin D (25 hydroxy)   AMB Referral VBCI Care Management   POCT HgB A1C   Meds ordered this encounter  Medications   amLODipine (NORVASC) 10 MG tablet    Sig: Take 1 tablet (10 mg total) by mouth daily.    Dispense:  90 tablet    Refill:  1   atorvastatin (LIPITOR) 80 MG tablet    Sig: Take 1 tablet (80 mg total) by mouth daily.    Dispense:  90 tablet    Refill:  1   hydrochlorothiazide (HYDRODIURIL) 25 MG tablet    Sig: Take 1 tablet (25 mg total) by mouth daily.    Dispense:  90 tablet    Refill:  1   metFORMIN (GLUCOPHAGE) 500 MG tablet    Sig: Take 1 tablet (500 mg total) by mouth 2 (two) times daily with a meal.    Dispense:  180 tablet    Refill:  1   metoprolol (TOPROL-XL) 200 MG 24 hr tablet    Sig: Take 1 tablet (200 mg total) by mouth daily.    Dispense:  90 tablet    Refill:  1   potassium chloride SA (KLOR-CON M) 20 MEQ tablet    Sig: Take 1 tablet (20 mEq total) by mouth 2 (two) times daily.    Dispense:  90 tablet    Refill:  1   alendronate (FOSAMAX) 70 MG tablet    Sig: Take 1 tablet (70 mg total) by mouth once a week. Empty stomach with a full glass of water    Dispense:  4 tablet    Refill:  11   Referral Orders         AMB Referral VBCI Care Management       Note is dictated utilizing voice recognition software. Although note has been proof read prior to signing, occasional  typographical errors still can be missed. If any questions arise, please do not hesitate to call for verification.  Electronically signed by: Felix Pacini, DO [facility] [place]

## 2023-05-25 NOTE — Patient Instructions (Addendum)

## 2023-05-26 ENCOUNTER — Ambulatory Visit: Payer: Medicare Other | Admitting: Podiatry

## 2023-05-26 ENCOUNTER — Telehealth: Payer: Self-pay | Admitting: *Deleted

## 2023-05-26 NOTE — Progress Notes (Signed)
  Care Coordination  Outreach Note  05/26/2023 Name: Sara Moreno MRN: [id number] DOB: June 13, 1946   Care Coordination Outreach Attempts: An unsuccessful telephone outreach was attempted today to offer the patient information about available care coordination services.  Follow Up Plan:  Additional outreach attempts will be made to offer the patient care coordination information and services.   Encounter Outcome:  No Answer  Burman Nieves, CCMA Care Coordination Care Guide Direct Dial: [phone]

## 2023-05-26 NOTE — Progress Notes (Signed)
  Care Coordination   Note   05/26/2023 Name: LIDYA LIPKO MRN: [id number] DOB: 1946/02/09  MAQUEL PETROVICH is a 77 y.o. year old female who sees Kuneff, Renee A, DO for primary care. I reached out to [name] by phone today to offer care coordination services.  Ms. Rushlow was given information about Care Coordination services today including:   The Care Coordination services include support from the care team which includes your Nurse Coordinator, Clinical Social Worker, or Pharmacist.  The Care Coordination team is here to help remove barriers to the health concerns and goals most important to you. Care Coordination services are voluntary, and the patient may decline or stop services at any time by request to their care team member.   Care Coordination Consent Status: Patient agreed to services and verbal consent obtained.   Follow up plan:  Telephone appointment with care coordination team member scheduled for:  05/31/2023  Encounter Outcome:  Patient Scheduled from referral   Burman Nieves, [facility] Care Coordination Care Guide Direct Dial: [phone]

## 2023-05-27 ENCOUNTER — Ambulatory Visit: Payer: Medicare Other | Admitting: Podiatry

## 2023-05-27 LAB — QUANTIFERON-TB GOLD PLUS
Mitogen-NIL: 7.82 [IU]/mL
NIL: 0.01 [IU]/mL
QuantiFERON-TB Gold Plus: NEGATIVE
TB1-NIL: 0 [IU]/mL
TB2-NIL: 0.01 [IU]/mL

## 2023-05-31 ENCOUNTER — Ambulatory Visit: Payer: Self-pay | Admitting: Licensed Clinical Social Worker

## 2023-06-01 NOTE — Patient Instructions (Signed)
Visit Information  Thank you for taking time to visit with me today. Please don't hesitate to contact me if I can be of assistance to you.   Following are the goals we discussed today:   Goals Addressed             This Visit's Progress    Obtain Supportive Resources   On track    Activities and task to complete in order to accomplish goals.   Keep all upcoming appointments discussed today Continue with compliance of taking medication prescribed by Doctor Implement healthy coping skills discussed to assist with management of symptoms Continue working with [facility] care team to assist with goals identified         Our next appointment is by telephone on 11/19 at 1:30 PM  Please call the care guide team at [phone] if you need to cancel or reschedule your appointment.   If you are experiencing a Mental Health or Behavioral Health Crisis or need someone to talk to, please call the Suicide and Crisis Lifeline: 988 call 911   Patient verbalizes understanding of instructions and care plan provided today and agrees to view in MyChart. Active MyChart status and patient understanding of how to access instructions and care plan via MyChart confirmed with patient.     Jenel Lucks, MSW, LCSW [facility] Care Management [place]  [facility] [email or web address] Phone [phone] 12:57 PM

## 2023-06-01 NOTE — Patient Outreach (Signed)
  Care Coordination   Follow Up Visit Note   05/31/2023 Name: Sara Moreno MRN: [id number] DOB: 01-15-1946  Sara Moreno is Moreno 77 y.o. year old female who sees Kuneff, Renee A, DO for primary care. I spoke with  Sara Moreno's Daughter In Sara Moreno, by phone today.  What matters to the patients health and wellness today?  Level of Care options    Goals Addressed             This Visit's Progress    Obtain Supportive Resources   On track    Activities and task to complete in order to accomplish goals.   Keep all upcoming appointments discussed today Continue with compliance of taking medication prescribed by Doctor Implement healthy coping skills discussed to assist with management of symptoms Continue working with [facility] care team to assist with goals identified         SDOH assessments and interventions completed:  No     Care Coordination Interventions:  Yes, provided  Interventions Today    Flowsheet Row Most Recent Value  Chronic Disease   Chronic disease during today's visit Diabetes, Hypertension (HTN), Other  [Diabetic retinopathy]  General Interventions   General Interventions Discussed/Reviewed General Interventions Reviewed, [facility], Doctor Visits, Level of Care  Doctor Visits Discussed/Reviewed Doctor Visits Reviewed  Level of Care Adult Daycare, Applications, Assisted Living  Applications Medicaid  Education Interventions   Applications Medicaid  Mental Health Interventions   Mental Health Discussed/Reviewed Mental Health Reviewed, Coping Strategies  Nutrition Interventions   Nutrition Discussed/Reviewed Nutrition Reviewed  Pharmacy Interventions   Pharmacy Dicussed/Reviewed Pharmacy Topics Reviewed, Medication Adherence  Safety Interventions   Safety Discussed/Reviewed Safety Reviewed       Follow up plan: Follow up call scheduled for 2-4 weeks    Encounter Outcome:  Patient Visit Completed   Sara Moreno, MSW, LCSW [facility] Care  Management [facility] Health  [facility] [email or web address] Phone [phone] 12:57 PM

## 2023-06-07 ENCOUNTER — Ambulatory Visit: Payer: Medicare Other | Admitting: Family Medicine

## 2023-06-21 ENCOUNTER — Ambulatory Visit: Payer: Self-pay | Admitting: Licensed Clinical Social Worker

## 2023-06-22 NOTE — Patient Instructions (Signed)
Visit Information  Thank you for taking time to visit with me today. Please don't hesitate to contact me if I can be of assistance to you.   Following are the goals we discussed today:   Goals Addressed             This Visit's Progress    Obtain Supportive Resources   On track    Activities and task to complete in order to accomplish goals.   Keep all upcoming appointments discussed today Continue with compliance of taking medication prescribed by Doctor Implement healthy coping skills discussed to assist with management of symptoms Continue working with [facility] care team to assist with goals identified         Our next appointment is by telephone on 12/30 at 10 AM  Please call the care guide team at [phone] if you need to cancel or reschedule your appointment.   If you are experiencing a Mental Health or Behavioral Health Crisis or need someone to talk to, please call the Suicide and Crisis Lifeline: 988 call 911   Patient verbalizes understanding of instructions and care plan provided today and agrees to view in MyChart. Active MyChart status and patient understanding of how to access instructions and care plan via MyChart confirmed with patient.     Jenel Lucks, MSW, LCSW [facility] Care Management [place]  [facility] [email or web address] Phone [phone] 9:00 AM

## 2023-06-22 NOTE — Patient Outreach (Signed)
  Care Coordination   Follow Up Visit Note   06/21/2023 Name: JANEECE GRAEFE MRN: [id number] DOB: 1945-09-07  MICHAELA BOULOS is a 77 y.o. year old female who sees Kuneff, Renee A, DO for primary care. I spoke with  [name] by phone today.  What matters to the patients health and wellness today?  Level of Care    Goals Addressed             This Visit's Progress    Obtain Supportive Resources   On track    Activities and task to complete in order to accomplish goals.   Keep all upcoming appointments discussed today Continue with compliance of taking medication prescribed by Doctor Implement healthy coping skills discussed to assist with management of symptoms Continue working with [facility] care team to assist with goals identified         SDOH assessments and interventions completed:  No     Care Coordination Interventions:  Yes, provided  Interventions Today    Flowsheet Row Most Recent Value  Chronic Disease   Chronic disease during today's visit Diabetes, Hypertension (HTN), Other  [Diabetic retinopathy]  General Interventions   General Interventions Discussed/Reviewed General Interventions Reviewed, Doctor Visits, Level of Care  [Family are waiting to hear back from DSS regarding Medicaid LTC application. Will f/up with PCP regarding FL2, when needed]  Doctor Visits Discussed/Reviewed Doctor Visits Reviewed  Level of Care Applications  Applications Medicaid, FL-2  Education Interventions   Applications Medicaid, FL-2  Safety Interventions   Safety Discussed/Reviewed Safety Reviewed       Follow up plan: Follow up call scheduled for 4-6 weeks    Encounter Outcome:  Patient Visit Completed   Jenel Lucks, MSW, LCSW [facility] Care Management [facility] Health  [facility] [email or web address] Phone [phone] 8:59 AM

## 2023-07-02 DIAGNOSIS — Z6831 Body mass index (BMI) 31.0-31.9, adult: Secondary | ICD-10-CM | POA: Diagnosis not present

## 2023-07-02 DIAGNOSIS — R399 Unspecified symptoms and signs involving the genitourinary system: Secondary | ICD-10-CM | POA: Diagnosis not present

## 2023-07-02 DIAGNOSIS — L210 Seborrhea capitis: Secondary | ICD-10-CM | POA: Diagnosis not present

## 2023-07-02 DIAGNOSIS — R21 Rash and other nonspecific skin eruption: Secondary | ICD-10-CM | POA: Diagnosis not present

## 2023-07-06 ENCOUNTER — Ambulatory Visit: Payer: Medicare Other | Admitting: Podiatry

## 2023-07-14 ENCOUNTER — Telehealth: Payer: Self-pay | Admitting: Family Medicine

## 2023-07-14 NOTE — Telephone Encounter (Signed)
Patient Son  dropped off FL2 Form  document , to be filled out by provider. Patient requested to send it back via Call Patient to pick up within 7-days. Document is located in providers tray at front office.Please advise at Mobile [phone] (mobile)

## 2023-07-15 DIAGNOSIS — Z0279 Encounter for issue of other medical certificate: Secondary | ICD-10-CM

## 2023-07-15 NOTE — Telephone Encounter (Signed)
Completed patient's FL 2 form.  She moving into an assisted living facility or independent living facility. This box (#11) needs filled and once answers given.  Thanks

## 2023-07-18 NOTE — Telephone Encounter (Signed)
Form placed in HIPAA folder to go up front with note for pt to be called for pick up

## 2023-07-18 NOTE — Telephone Encounter (Signed)
LM for pt to return call to discuss.  

## 2023-08-01 ENCOUNTER — Ambulatory Visit: Payer: Self-pay | Admitting: Licensed Clinical Social Worker

## 2023-08-01 NOTE — Patient Outreach (Signed)
  Care Coordination   Follow Up Visit Note   08/01/2023 Name: Sara Moreno MRN: [id number] DOB: 1945/10/12  Sara Moreno is a 77 y.o. year old female who sees Kuneff, Renee A, DO for primary care. I spoke with  Sara Moreno by phone today.  What matters to the patients health and wellness today?  Symptom Management and Level of Care    Goals Addressed             This Visit's Progress    Obtain Supportive Resources   On track    Activities and task to complete in order to accomplish goals.   Keep all upcoming appointments discussed today Continue with compliance of taking medication prescribed by Doctor Implement healthy coping skills discussed to assist with management of symptoms Continue working with [facility] care team to assist with goals identified Follow up with Medicaid about pending application         SDOH assessments and interventions completed:  Yes  SDOH Interventions Today    Flowsheet Row Most Recent Value  SDOH Interventions   Food Insecurity Interventions Intervention Not Indicated  Housing Interventions Intervention Not Indicated  Transportation Interventions Intervention Not Indicated        Care Coordination Interventions:  Yes, provided  Interventions Today    Flowsheet Row Most Recent Value  Chronic Disease   Chronic disease during today's visit Diabetes, Hypertension (HTN)  General Interventions   General Interventions Discussed/Reviewed General Interventions Reviewed, [facility], Doctor Visits, Level of Care, Horticulturist, commercial (DME)  Doctor Visits Discussed/Reviewed Doctor Visits Reviewed  Durable Medical Equipment (DME) Other  [Patient continues to utilize cane in the home]  Level of Care Applications  Applications Medicaid  [Still pending, son will f/up]  Education Interventions   Applications Medicaid  [Still pending, son will f/up]  Mental Health Interventions   Mental Health Discussed/Reviewed Mental Health Reviewed,  Coping Strategies, Depression, Grief and Loss  Nutrition Interventions   Nutrition Discussed/Reviewed Nutrition Reviewed  [Discussed [facility] wait list. [facility] Be[facility] to make referral. LCSW will f/up]  Pharmacy Interventions   Pharmacy Dicussed/Reviewed Pharmacy Topics Reviewed, Medication Adherence  Safety Interventions   Safety Discussed/Reviewed Safety Reviewed, Home Safety       Follow up plan: Follow up call scheduled for 2-3 weeks    Encounter Outcome:  Patient Visit Completed   Jenel Lucks, MSW, LCSW [facility] Care Management [facility] Health  [facility] [email or web address] Phone [phone] 10:25 AM

## 2023-08-01 NOTE — Patient Instructions (Signed)
Visit Information  Thank you for taking time to visit with me today. Please don't hesitate to contact me if I can be of assistance to you.   Following are the goals we discussed today:   Goals Addressed             This Visit's Progress    Obtain Supportive Resources   On track    Activities and task to complete in order to accomplish goals.   Keep all upcoming appointments discussed today Continue with compliance of taking medication prescribed by Doctor Implement healthy coping skills discussed to assist with management of symptoms Continue working with [facility] care team to assist with goals identified Follow up with Medicaid about pending application         Our next appointment is by telephone on 01/13 at 10 AM  Please call the care guide team at [phone] if you need to cancel or reschedule your appointment.   If you are experiencing a Mental Health or Behavioral Health Crisis or need someone to talk to, please call the Suicide and Crisis Lifeline: 988 call 911   Patient verbalizes understanding of instructions and care plan provided today and agrees to view in MyChart. Active MyChart status and patient understanding of how to access instructions and care plan via MyChart confirmed with patient.     Jenel Lucks, MSW, LCSW [facility] Care Management [place]  [facility] [email or web address] Phone [phone] 10:27 AM

## 2023-08-15 ENCOUNTER — Ambulatory Visit: Payer: Self-pay | Admitting: Licensed Clinical Social Worker

## 2023-08-18 NOTE — Patient Instructions (Signed)
Visit Information  Thank you for taking time to visit with me today. Please don't hesitate to contact me if I can be of assistance to you before our next scheduled telephone appointment.  Following are the goals we discussed today:  (Copy and paste patient goals from clinical care plan here)  Our next appointment is by telephone on 2/10 at 10 AM  Please call the care guide team at [phone] if you need to cancel or reschedule your appointment.   If you are experiencing a Mental Health or Behavioral Health Crisis or need someone to talk to, please call the Suicide and Crisis Lifeline: 988 call 911   Patient verbalizes understanding of instructions and care plan provided today and agrees to view in MyChart. Active MyChart status and patient understanding of how to access instructions and care plan via MyChart confirmed with patient.     Windy Fast [facility]  [facility], [facility] Clinical Social Worker Direct Dial: [phone]  Fax: [phone] Website: [email or web address] 5:17 PM

## 2023-08-18 NOTE — Patient Outreach (Signed)
  Care Coordination   Follow Up Visit Note   08/15/2023 Name: Sara Moreno MRN: [id number] DOB: 13-Jun-1946  Sara Moreno is a 78 y.o. year old female who sees Kuneff, Renee A, DO for primary care. I spoke with  Sara Moreno by phone today.  What matters to the patients health and wellness today?  Supportive Resources    Goals Addressed             This Visit's Progress    Obtain Supportive Resources   On track    Activities and task to complete in order to accomplish goals.   Keep all upcoming appointments discussed today Continue with compliance of taking medication prescribed by Doctor Implement healthy coping skills discussed to assist with management of symptoms Continue working with [facility] care team to assist with goals identified Follow up with Medicaid about pending application Follow up with [facility] to inquire about supplies and resources         SDOH assessments and interventions completed:  No     Care Coordination Interventions:  Yes, provided  Interventions Today    Flowsheet Row Most Recent Value  Chronic Disease   Chronic disease during today's visit Diabetes, Hypertension (HTN), Other  [Vision Impaired]  General Interventions   General Interventions Discussed/Reviewed General Interventions Reviewed, [facility], Doctor Visits  Doctor Visits Discussed/Reviewed Doctor Visits Reviewed  Mental Health Interventions   Mental Health Discussed/Reviewed Mental Health Reviewed, Coping Strategies       Follow up plan: Follow up call scheduled for 2-4 weeks    Encounter Outcome:  Patient Visit Completed   Jenel Lucks, LCSW [place]  [facility], [facility] Clinical Social Worker Direct Dial: [phone]  Fax: [phone] Website: [email or web address] 5:16 PM

## 2023-08-31 ENCOUNTER — Other Ambulatory Visit: Payer: Self-pay | Admitting: Family Medicine

## 2023-09-12 ENCOUNTER — Encounter: Payer: Self-pay | Admitting: Licensed Clinical Social Worker

## 2023-09-15 NOTE — Patient Outreach (Signed)
  Care Coordination   Follow Up Visit Note   09/12/2023 Name: KATTY FRETWELL MRN: [id number] DOB: [birth year]  CHARON SMEDBERG is a 78 y.o. year old female who sees Kuneff, Renee A, DO for primary care. I spoke with  [name] by phone today.  What matters to the patients health and wellness today?  Level of Care    Goals Addressed             This Visit's Progress    Obtain Supportive Resources   On track    Activities and task to complete in order to accomplish goals.   Keep all upcoming appointments discussed today Continue with compliance of taking medication prescribed by Doctor Implement healthy coping skills discussed to assist with management of symptoms Continue working with [facility] care team to assist with goals identified Follow up with Medicaid about pending application Follow up with [facility] to inquire about supplies and resources         SDOH assessments and interventions completed:  No     Care Coordination Interventions:  Yes, provided  Interventions Today    Flowsheet Row Most Recent Value  Chronic Disease   Chronic disease during today's visit Diabetes, Hypertension (HTN)  General Interventions   General Interventions Discussed/Reviewed General Interventions Reviewed, Doctor Visits, Level of Care, Communication with  Communication with Social Work  [LCSW emailed Maretta Bees with [facility]]  Level of [employer]  [Family has not heard from DSS regarding submitted MA application]  Education Interventions   Applications Medicaid  [Family has not heard from DSS regarding submitted MA application]  Mental Health Interventions   Mental Health Discussed/Reviewed Mental Health Reviewed, Coping Strategies       Follow up plan: Follow up call scheduled for 2-4 weeks    Encounter Outcome:  Patient Visit Completed   Jenel Lucks, LCSW [place]  [facility], [facility] Clinical  Social Worker Direct Dial: [phone]  Fax: [phone] Website: [email or web address] 9:25 AM

## 2023-09-15 NOTE — Patient Instructions (Signed)
Visit Information  Thank you for taking time to visit with me today. Please don't hesitate to contact me if I can be of assistance to you.   Following are the goals we discussed today:   Goals Addressed             This Visit's Progress    Obtain Supportive Resources   On track    Activities and task to complete in order to accomplish goals.   Keep all upcoming appointments discussed today Continue with compliance of taking medication prescribed by Doctor Implement healthy coping skills discussed to assist with management of symptoms Continue working with [facility] care team to assist with goals identified Follow up with Medicaid about pending application Follow up with [facility] to inquire about supplies and resources         Our next appointment is by telephone on 3/10 at 10 AM  Please call the care guide team at [phone] if you need to cancel or reschedule your appointment.   If you are experiencing a Mental Health or Behavioral Health Crisis or need someone to talk to, please call the Suicide and Crisis Lifeline: 988 call 911   Patient verbalizes understanding of instructions and care plan provided today and agrees to view in MyChart. Active MyChart status and patient understanding of how to access instructions and care plan via MyChart confirmed with patient.     Windy Fast [facility]  [facility], [facility] Clinical Social Worker Direct Dial: [phone]  Fax: [phone] Website: [email or web address] 9:27 AM

## 2023-10-05 ENCOUNTER — Ambulatory Visit: Payer: Medicare Other | Admitting: *Deleted

## 2023-10-05 DIAGNOSIS — Z0000 Encounter for general adult medical examination without abnormal findings: Secondary | ICD-10-CM | POA: Diagnosis not present

## 2023-10-05 DIAGNOSIS — Z780 Asymptomatic menopausal state: Secondary | ICD-10-CM

## 2023-10-05 NOTE — Patient Instructions (Signed)
 Sara Moreno , Thank you for taking time to come for your Medicare Wellness Visit. I appreciate your ongoing commitment to your health goals. Please review the following plan we discussed and let me know if I can assist you in the future.   Screening recommendations/referrals: Colonoscopy: no longer required Recommended yearly ophthalmology/optometry visit for glaucoma screening and checkup Recommended yearly dental visit for hygiene and checkup  Vaccinations: Influenza vaccine: up to date Pneumococcal vaccine: up to date Tdap vaccine: Education provided Shingles vaccine: up to date        Preventive Care 65 Years and Older, Female Preventive care refers to lifestyle choices and visits with your health care provider that can promote health and wellness. What does preventive care include? A yearly physical exam. This is also called an annual well check. Dental exams once or twice a year. Routine eye exams. Ask your health care provider how often you should have your eyes checked. Personal lifestyle choices, including: Daily care of your teeth and gums. Regular physical activity. Eating a healthy diet. Avoiding tobacco and drug use. Limiting alcohol use. Practicing safe sex. Taking low doses of aspirin every day. Taking vitamin and mineral supplements as recommended by your health care provider. What happens during an annual well check? The services and screenings done by your health care provider during your annual well check will depend on your age, overall health, lifestyle risk factors, and family history of disease. Counseling  Your health care provider may ask you questions about your: Alcohol use. Tobacco use. Drug use. Emotional well-being. Home and relationship well-being. Sexual activity. Eating habits. History of falls. Memory and ability to understand (cognition). Work and work Astronomer. Screening  You may have the following tests or measurements: Height,  weight, and BMI. Blood pressure. Lipid and cholesterol levels. These may be checked every 5 years, or more frequently if you are over 5 years old. Skin check. Lung cancer screening. You may have this screening every year starting at age 57 if you have a 30-pack-year history of smoking and currently smoke or have quit within the past 15 years. Fecal occult blood test (FOBT) of the stool. You may have this test every year starting at age 25. Flexible sigmoidoscopy or colonoscopy. You may have a sigmoidoscopy every 5 years or a colonoscopy every 10 years starting at age 50. Prostate cancer screening. Recommendations will vary depending on your family history and other risks. Hepatitis C blood test. Hepatitis B blood test. Sexually transmitted disease (STD) testing. Diabetes screening. This is done by checking your blood sugar (glucose) after you have not eaten for a while (fasting). You may have this done every 1-3 years. Abdominal aortic aneurysm (AAA) screening. You may need this if you are a current or former smoker. Osteoporosis. You may be screened starting at age 12 if you are at high risk. Talk with your health care provider about your test results, treatment options, and if necessary, the need for more tests. Vaccines  Your health care provider may recommend certain vaccines, such as: Influenza vaccine. This is recommended every year. Tetanus, diphtheria, and acellular pertussis (Tdap, Td) vaccine. You may need a Td booster every 10 years. Zoster vaccine. You may need this after age 33. Pneumococcal 13-valent conjugate (PCV13) vaccine. One dose is recommended after age 34. Pneumococcal polysaccharide (PPSV23) vaccine. One dose is recommended after age 81. Talk to your health care provider about which screenings and vaccines you need and how often you need them. This information is not  intended to replace advice given to you by your health care provider. Make sure you discuss any  questions you have with your health care provider. Document Released: 08/15/2015 Document Revised: 04/07/2016 Document Reviewed: 05/20/2015 Elsevier Interactive Patient Education  2017 [employer].  Fall Prevention in the Home Falls can cause injuries. They can happen to people of all ages. There are many things you can do to make your home safe and to help prevent falls. What can I do on the outside of my home? Regularly fix the edges of walkways and driveways and fix any cracks. Remove anything that might make you trip as you walk through a door, such as a raised step or threshold. Trim any bushes or trees on the path to your home. Use bright outdoor lighting. Clear any walking paths of anything that might make someone trip, such as rocks or tools. Regularly check to see if handrails are loose or broken. Make sure that both sides of any steps have handrails. Any raised decks and porches should have guardrails on the edges. Have any leaves, snow, or ice cleared regularly. Use sand or salt on walking paths during winter. Clean up any spills in your garage right away. This includes oil or grease spills. What can I do in the bathroom? Use night lights. Install grab bars by the toilet and in the tub and shower. Do not use towel bars as grab bars. Use non-skid mats or decals in the tub or shower. If you need to sit down in the shower, use a plastic, non-slip stool. Keep the floor dry. Clean up any water that spills on the floor as soon as it happens. Remove soap buildup in the tub or shower regularly. Attach bath mats securely with double-sided non-slip rug tape. Do not have throw rugs and other things on the floor that can make you trip. What can I do in the bedroom? Use night lights. Make sure that you have a light by your bed that is easy to reach. Do not use any sheets or blankets that are too big for your bed. They should not hang down onto the floor. Have a firm chair that has side  arms. You can use this for support while you get dressed. Do not have throw rugs and other things on the floor that can make you trip. What can I do in the kitchen? Clean up any spills right away. Avoid walking on wet floors. Keep items that you use a lot in easy-to-reach places. If you need to reach something above you, use a strong step stool that has a grab bar. Keep electrical cords out of the way. Do not use floor polish or wax that makes floors slippery. If you must use wax, use non-skid floor wax. Do not have throw rugs and other things on the floor that can make you trip. What can I do with my stairs? Do not leave any items on the stairs. Make sure that there are handrails on both sides of the stairs and use them. Fix handrails that are broken or loose. Make sure that handrails are as long as the stairways. Check any carpeting to make sure that it is firmly attached to the stairs. Fix any carpet that is loose or worn. Avoid having throw rugs at the top or bottom of the stairs. If you do have throw rugs, attach them to the floor with carpet tape. Make sure that you have a light switch at the top of the stairs  and the bottom of the stairs. If you do not have them, ask someone to add them for you. What else can I do to help prevent falls? Wear shoes that: Do not have high heels. Have rubber bottoms. Are comfortable and fit you well. Are closed at the toe. Do not wear sandals. If you use a stepladder: Make sure that it is fully opened. Do not climb a closed stepladder. Make sure that both sides of the stepladder are locked into place. Ask someone to hold it for you, if possible. Clearly mark and make sure that you can see: Any grab bars or handrails. First and last steps. Where the edge of each step is. Use tools that help you move around (mobility aids) if they are needed. These include: Canes. Walkers. Scooters. Crutches. Turn on the lights when you go into a dark area.  Replace any light bulbs as soon as they burn out. Set up your furniture so you have a clear path. Avoid moving your furniture around. If any of your floors are uneven, fix them. If there are any pets around you, be aware of where they are. Review your medicines with your doctor. Some medicines can make you feel dizzy. This can increase your chance of falling. Ask your doctor what other things that you can do to help prevent falls. This information is not intended to replace advice given to you by your health care provider. Make sure you discuss any questions you have with your health care provider. Document Released: 05/15/2009 Document Revised: 12/25/2015 Document Reviewed: 08/23/2014 Elsevier Interactive Patient Education  2017 [employer].

## 2023-10-05 NOTE — Progress Notes (Signed)
 Subjective:   Sara Moreno is a 78 y.o. female who presents for Medicare Annual (Subsequent) preventive examination.  Visit Complete: Virtual I connected with  Sara Moreno on 10/05/23 by a audio enabled telemedicine application and verified that I am speaking with the correct person using two identifiers.  Patient Location: Home  Provider Location: Home Office  I discussed the limitations of evaluation and management by telemedicine. The patient expressed understanding and agreed to proceed.  Vital Signs: Because this visit was a virtual/telehealth visit, some criteria may be missing or patient reported. Any vitals not documented were not able to be obtained and vitals that have been documented are patient reported.    Cardiac Risk Factors include: advanced age (>20men, >59 women);diabetes mellitus;family history of premature cardiovascular disease     Objective:    There were no vitals filed for this visit. There is no height or weight on file to calculate BMI.     10/05/2023   10:38 AM 09/22/2022   11:25 AM 03/31/2015   10:27 PM  Advanced Directives  Does Patient Have a Medical Advance Directive? No No No  Would patient like information on creating a medical advance directive? No - Patient declined No - Patient declined No - patient declined information    Current Medications (verified) Outpatient Encounter Medications as of 10/05/2023  Medication Sig   alendronate (FOSAMAX) 70 MG tablet Take 1 tablet (70 mg total) by mouth once a week. Empty stomach with a full glass of water   amLODipine (NORVASC) 10 MG tablet Take 1 tablet (10 mg total) by mouth daily.   aspirin 81 MG chewable tablet Chew by mouth daily.   atorvastatin (LIPITOR) 80 MG tablet Take 1 tablet (80 mg total) by mouth daily.   brimonidine (ALPHAGAN) 0.2 % ophthalmic solution Apply to eye.   Continuous Blood Gluc Receiver (DEXCOM G7 RECEIVER) DEVI See admin instructions.   Continuous Glucose Sensor (DEXCOM G7  SENSOR) MISC 1 AS DIRECTED   dorzolamide-timolol (COSOPT) 2-0.5 % ophthalmic solution SMARTSIG:In Eye(s)   hydrochlorothiazide (HYDRODIURIL) 25 MG tablet Take 1 tablet (25 mg total) by mouth daily.   meclizine (ANTIVERT) 25 MG tablet Take 1 tablet (25 mg total) by mouth 3 (three) times daily as needed for dizziness.   metFORMIN (GLUCOPHAGE) 500 MG tablet Take 1 tablet (500 mg total) by mouth 2 (two) times daily with a meal.   metoprolol (TOPROL-XL) 200 MG 24 hr tablet Take 1 tablet (200 mg total) by mouth daily.   potassium chloride SA (KLOR-CON M) 20 MEQ tablet Take 1 tablet (20 mEq total) by mouth 2 (two) times daily.   prednisoLONE acetate (PRED FORTE) 1 % ophthalmic suspension Place 1 drop into the right eye 3 (three) times daily.   No facility-administered encounter medications on file as of 10/05/2023.    Allergies (verified) Patient has no known allergies.   History: Past Medical History:  Diagnosis Date   Arthritis    Blind    Chicken pox    Colon polyp    Diabetes mellitus without complication (HCC)    Glaucoma    Heart murmur    Hypercholesterolemia    Hypertension    Ingrowing toenail 12/02/2021   Past Surgical History:  Procedure Laterality Date   CATARACT EXTRACTION     CESAREAN SECTION  1971   COLONOSCOPY  2022   TONSILLECTOMY  1955   Family History  Problem Relation Age of Onset   Hypertension Mother    Arthritis Mother  Heart attack Mother    Heart disease Mother    Hypertension Father    Arthritis Father    Hypertension Sister    Arthritis Sister    Hearing loss Sister    Hypertension Son    Diabetes Son    Asthma Son    Depression Son    Social History   Socioeconomic History   Marital status: Widowed    Spouse name: Not on file   Number of children: Not on file   Years of education: Not on file   Highest education level: Not on file  Occupational History   Not on file  Tobacco Use   Smoking status: Never    Passive exposure: Never    Smokeless tobacco: Never  Vaping Use   Vaping status: Never Used  Substance and Sexual Activity   Alcohol use: No   Drug use: Never   Sexual activity: Not Currently  Other Topics Concern   Not on file  Social History Narrative   Marital status/children/pets: Widow.  Widow   Education/employment: Retired, high school education   Safety:      -smoke alarm in the home:Yes     - wears seatbelt: Yes     - Feels safe in their relationships: Yes      Social Drivers of Corporate investment banker Strain: Low Risk  (10/05/2023)   Overall Financial Resource Strain (CARDIA)    Difficulty of Paying Living Expenses: Not hard at all  Food Insecurity: No Food Insecurity (10/05/2023)   Hunger Vital Sign    Worried About Running Out of Food in the Last Year: Never true    Ran Out of Food in the Last Year: Never true  Transportation Needs: No Transportation Needs (10/05/2023)   PRAPARE - Administrator, Civil Service (Medical): No    Lack of Transportation (Non-Medical): No  Physical Activity: Inactive (10/05/2023)   Exercise Vital Sign    Days of Exercise per Week: 0 days    Minutes of Exercise per Session: 0 min  Stress: Stress Concern Present (10/05/2023)   [employer] of Occupational Health - Occupational Stress Questionnaire    Feeling of Stress : To some extent  Social Connections: Socially Isolated (10/05/2023)   Social Connection and Isolation Panel [NHANES]    Frequency of Communication with Friends and Family: More than three times a week    Frequency of Social Gatherings with Friends and Family: Never    Attends Religious Services: Never    Database administrator or Organizations: No    Attends Banker Meetings: Never    Marital Status: Widowed    Tobacco Counseling Counseling given: Not Answered   Clinical Intake:  Pre-visit preparation completed: Yes  Pain : No/denies pain     Diabetes: Yes CBG done?: No Did pt. bring in CBG monitor from  home?: No  How often do you need to have someone help you when you read instructions, pamphlets, or other written materials from your doctor or pharmacy?: 1 - Never  Interpreter Needed?: No  Information entered by :: Remi Haggard LPN   Activities of Daily Living    10/05/2023   10:39 AM  In your present state of health, do you have any difficulty performing the following activities:  Hearing? 0  Vision? 0  Difficulty concentrating or making decisions? 0  Walking or climbing stairs? 1  Dressing or bathing? 0  Doing errands, shopping? 1  Preparing Food and eating ?  N  Using the Toilet? N  In the past six months, have you accidently leaked urine? N  Do you have problems with loss of bowel control? N  Managing your Medications? Y  Managing your Finances? Y  Housekeeping or managing your Housekeeping? Y    Patient Care Team: Natalia Leatherwood, DO as PCP - General (Family Medicine) Bond, Doran Stabler, MD as Referring Physician (Ophthalmology) Bridgett Larsson, LCSW as Social Worker (Licensed Clinical Social Worker)  Indicate any recent Medical Services you may have received from other than [facility] providers in the past year (date may be approximate).     Assessment:   This is a routine wellness examination for Aleysha.  Hearing/Vision screen Hearing Screening - Comments:: No trouble hearing Vision Screening - Comments:: Up to date [facility] Blind   Goals Addressed             This Visit's Progress    Patient Stated       Continue current lifestyle       Depression Screen    10/05/2023   10:42 AM 05/25/2023    9:56 AM 01/26/2023    1:15 PM 12/21/2022    2:08 PM 11/03/2022    3:18 PM 09/22/2022   11:23 AM 09/08/2022    1:27 PM  PHQ 2/9 Scores  PHQ - 2 Score 3 4 2  0 0 1 0  PHQ- 9 Score 3 8 2  0 0  1    Fall Risk    10/05/2023   10:38 AM 05/25/2023    9:56 AM 01/26/2023    1:15 PM 12/21/2022    2:08 PM 11/03/2022    3:17 PM  Fall Risk   Falls in the  past year? 0 0 0 0 0  Number falls in past yr: 0 0 0 0   Injury with Fall? 0 0 0 0   Risk for fall due to :  No Fall Risks Impaired balance/gait;Impaired vision No Fall Risks   Follow up Falls evaluation completed;Education provided;Falls prevention discussed Falls evaluation completed Falls evaluation completed Falls evaluation completed     MEDICARE RISK AT HOME: Medicare Risk at Home Any stairs in or around the home?: No If so, are there any without handrails?: No Home free of loose throw rugs in walkways, pet beds, electrical cords, etc?: Yes Adequate lighting in your home to reduce risk of falls?: Yes Life alert?: No Use of a cane, walker or w/c?: Yes Grab bars in the bathroom?: No Shower chair or bench in shower?: Yes Elevated toilet seat or a handicapped toilet?: Yes  TIMED UP AND GO:  Was the test performed?  No    Cognitive Function:        10/05/2023   10:41 AM 09/22/2022   11:27 AM  6CIT Screen  What Year? 0 points 0 points  What month? 0 points 0 points  What time? 0 points 0 points  Count back from 20 0 points 0 points  Months in reverse 2 points 0 points  Repeat phrase 10 points 0 points  Total Score 12 points 0 points    Immunizations Immunization History  Administered Date(s) Administered   Fluad Quad(high Dose 65+) 05/30/2020, 05/02/2022   Influenza-Unspecified 05/03/2023   PFIZER(Purple Top)SARS-COV-2 Vaccination 11/08/2019, 11/29/2019, 05/27/2020   PNEUMOCOCCAL CONJUGATE-20 05/04/2022   RSV,unspecified 05/04/2022   Unspecified SARS-COV-2 Vaccination 05/03/2022   Zoster Recombinant(Shingrix) 06/11/2021, 07/13/2021    TDAP status: Due, Education has been  provided regarding the importance of this vaccine. Advised may receive this vaccine at local pharmacy or Health Dept. Aware to provide a copy of the vaccination record if obtained from local pharmacy or Health Dept. Verbalized acceptance and understanding.  Flu Vaccine status: Up to  date  Pneumococcal vaccine status: Up to date  Covid-19 vaccine status: Information provided on how to obtain vaccines.   Qualifies for Shingles Vaccine? No   Zostavax completed Yes   Shingrix Completed?: No.    Education has been provided regarding the importance of this vaccine. Patient has been advised to call insurance company to determine out of pocket expense if they have not yet received this vaccine. Advised may also receive vaccine at local pharmacy or Health Dept. Verbalized acceptance and understanding.  Screening Tests Health Maintenance  Topic Date Due   Diabetic kidney evaluation - Urine ACR  09/09/2023   FOOT EXAM  09/09/2023   MAMMOGRAM  05/24/2024 (Originally 03/14/1964)   HEMOGLOBIN A1C  11/23/2023   OPHTHALMOLOGY EXAM  12/11/2023   Diabetic kidney evaluation - eGFR measurement  02/06/2024   Medicare Annual Wellness (AWV)  10/04/2024   Pneumonia Vaccine 91+ Years old  Completed   INFLUENZA VACCINE  Completed   DEXA SCAN  Completed   Zoster Vaccines- Shingrix  Completed   HPV VACCINES  Aged Out   DTaP/Tdap/Td  Discontinued   COVID-19 Vaccine  Discontinued   Hepatitis C Screening  Discontinued    Health Maintenance  Health Maintenance Due  Topic Date Due   Diabetic kidney evaluation - Urine ACR  09/09/2023   FOOT EXAM  09/09/2023    Colorectal cancer screening: No longer required.   Mammogram Education provided  Bone Density status: Ordered  . Pt provided with contact info and advised to call to schedule appt.  Lung Cancer Screening: (Low Dose CT Chest recommended if Age 55-80 years, 20 pack-year currently smoking OR have quit w/in 15years.) does not qualify.   Lung Cancer Screening Referral:   Additional Screening:  Hepatitis C Screening  never done  Vision Screening: Recommended annual ophthalmology exams for early detection of glaucoma and other disorders of the eye.   Patient is blind Is the patient up to date with their annual eye exam?  Yes   Who is the provider or what is the name of the office in which the patient attends annual eye exams? [facility]         If pt is not established with a provider, would they like to be referred to a provider to establish care? No .   Dental Screening: Recommended annual dental exams for proper oral hygiene Nutrition Risk Assessment:  Has the patient had any N/V/D within the last 2 months?  No  Does the patient have any non-healing wounds?  No  Has the patient had any unintentional weight loss or weight gain?  No   Diabetes:  Is the patient diabetic?  Yes  If diabetic, was a CBG obtained today?  No  Did the patient bring in their glucometer from home?  No  How often do you monitor your CBG's? Continuous Monitor.   Financial Strains and Diabetes Management:  Are you having any financial strains with the device, your supplies or your medication? No .  Does the patient want to be seen by Chronic Care Management for management of their diabetes?  No  Would the patient like to be referred to a Nutritionist or for Diabetic Management?  No   Diabetic Exams:  Diabetic Eye Exam: Completed . Pt has been advised about the importance in completing this exam.  Diabetic Foot Exam: . Pt has been advised about the importance in completing this exam. .    Community Resource Referral / Chronic Care Management: CRR required this visit?  No   CCM required this visit?  No     Plan:     I have personally reviewed and noted the following in the patient's chart:   Medical and social history Use of alcohol, tobacco or illicit drugs  Current medications and supplements including opioid prescriptions. Patient is not currently taking opioid prescriptions. Functional ability and status Nutritional status Physical activity Advanced directives List of other physicians Hospitalizations, surgeries, and ER visits in previous 12 months Vitals Screenings to include cognitive, depression, and  falls Referrals and appointments  In addition, I have reviewed and discussed with patient certain preventive protocols, quality metrics, and best practice recommendations. A written personalized care plan for preventive services as well as general preventive health recommendations were provided to patient.     Remi Haggard, LPN   03/08/5783   After Visit Summary: (MyChart) Due to this being a telephonic visit, the after visit summary with patients personalized plan was offered to patient via MyChart   Nurse Notes:

## 2023-10-10 ENCOUNTER — Encounter: Payer: Self-pay | Admitting: Licensed Clinical Social Worker

## 2023-10-10 ENCOUNTER — Telehealth: Payer: Self-pay | Admitting: Licensed Clinical Social Worker

## 2023-10-10 NOTE — Patient Outreach (Signed)
 Care Coordination   10/10/2023 Name: Sara Moreno MRN: [id number] DOB: 03-03-1946   Care Coordination Outreach Attempts:  An unsuccessful outreach was attempted for an appointment today.  Follow Up Plan:  Additional outreach attempts will be made to offer the patient complex care management information and services.   Encounter Outcome:  No Answer   Care Coordination Interventions:  No, not indicated    Jenel Lucks, LCSW [place]  [facility], [facility] Clinical Social Worker Direct Dial: [phone]  Fax: [phone] Website: [email or web address] 3:09 PM

## 2023-11-09 ENCOUNTER — Telehealth: Payer: Self-pay

## 2023-11-09 ENCOUNTER — Encounter: Payer: Self-pay | Admitting: Family Medicine

## 2023-11-09 ENCOUNTER — Other Ambulatory Visit (HOSPITAL_COMMUNITY): Payer: Self-pay

## 2023-11-09 ENCOUNTER — Ambulatory Visit (INDEPENDENT_AMBULATORY_CARE_PROVIDER_SITE_OTHER): Payer: Medicare Other | Admitting: Family Medicine

## 2023-11-09 VITALS — BP 136/79 | HR 66 | Ht 59.0 in | Wt 128.0 lb

## 2023-11-09 DIAGNOSIS — E11311 Type 2 diabetes mellitus with unspecified diabetic retinopathy with macular edema: Secondary | ICD-10-CM | POA: Diagnosis not present

## 2023-11-09 DIAGNOSIS — I10 Essential (primary) hypertension: Secondary | ICD-10-CM

## 2023-11-09 DIAGNOSIS — L299 Pruritus, unspecified: Secondary | ICD-10-CM | POA: Insufficient documentation

## 2023-11-09 DIAGNOSIS — Z7984 Long term (current) use of oral hypoglycemic drugs: Secondary | ICD-10-CM

## 2023-11-09 DIAGNOSIS — E785 Hyperlipidemia, unspecified: Secondary | ICD-10-CM

## 2023-11-09 DIAGNOSIS — E1169 Type 2 diabetes mellitus with other specified complication: Secondary | ICD-10-CM | POA: Diagnosis not present

## 2023-11-09 DIAGNOSIS — M810 Age-related osteoporosis without current pathological fracture: Secondary | ICD-10-CM | POA: Diagnosis not present

## 2023-11-09 LAB — TSH: TSH: 2.09 u[IU]/mL (ref 0.35–5.50)

## 2023-11-09 LAB — LIPID PANEL
Cholesterol: 275 mg/dL — ABNORMAL HIGH (ref 0–200)
HDL: 74.7 mg/dL (ref >39.00)
LDL Cholesterol: 170 mg/dL — ABNORMAL HIGH (ref 0–99)
NonHDL: 200.47
Total CHOL/HDL Ratio: 4
Triglycerides: 154 mg/dL — ABNORMAL HIGH (ref 0.0–149.0)
VLDL: 30.8 mg/dL (ref 0.0–40.0)

## 2023-11-09 LAB — COMPREHENSIVE METABOLIC PANEL WITH GFR
ALT: 10 U/L (ref 0–35)
AST: 12 U/L (ref 0–37)
Albumin: 4.6 g/dL (ref 3.5–5.2)
Alkaline Phosphatase: 56 U/L (ref 39–117)
BUN: 20 mg/dL (ref 6–23)
CO2: 36 meq/L — ABNORMAL HIGH (ref 19–32)
Calcium: 10.5 mg/dL (ref 8.4–10.5)
Chloride: 95 meq/L — ABNORMAL LOW (ref 96–112)
Creatinine, Ser: 0.89 mg/dL (ref 0.40–1.20)
GFR: 61.56 mL/min (ref >60.00)
Glucose, Bld: 103 mg/dL — ABNORMAL HIGH (ref 70–99)
Potassium: 3.6 meq/L (ref 3.5–5.1)
Sodium: 142 meq/L (ref 135–145)
Total Bilirubin: 0.5 mg/dL (ref 0.2–1.2)
Total Protein: 7.2 g/dL (ref 6.0–8.3)

## 2023-11-09 LAB — CBC
HCT: 44.5 % (ref 36.0–46.0)
Hemoglobin: 14.7 g/dL (ref 12.0–15.0)
MCHC: 33 g/dL (ref 30.0–36.0)
MCV: 89.1 fl (ref 78.0–100.0)
Platelets: 231 10*3/uL (ref 150.0–400.0)
RBC: 4.99 Mil/uL (ref 3.87–5.11)
RDW: 14.6 % (ref 11.5–15.5)
WBC: 8.6 10*3/uL (ref 4.0–10.5)

## 2023-11-09 LAB — MICROALBUMIN / CREATININE URINE RATIO
Creatinine,U: 138 mg/dL
Microalb Creat Ratio: 59 mg/g — ABNORMAL HIGH (ref 0.0–30.0)
Microalb, Ur: 8.1 mg/dL — ABNORMAL HIGH (ref 0.0–1.9)

## 2023-11-09 LAB — HEMOGLOBIN A1C: Hgb A1c MFr Bld: 6.9 % — ABNORMAL HIGH (ref 4.6–6.5)

## 2023-11-09 MED ORDER — HYDROXYZINE HCL 10 MG PO TABS: 10.0000 mg | ORAL_TABLET | Freq: Every evening | ORAL | 1 refills | Status: DC | PRN

## 2023-11-09 MED ORDER — METFORMIN HCL 500 MG PO TABS: 500.0000 mg | ORAL_TABLET | Freq: Two times a day (BID) | ORAL | 1 refills | Status: DC

## 2023-11-09 MED ORDER — DEXCOM G7 SENSOR MISC: 5 refills | Status: DC

## 2023-11-09 MED ORDER — AMLODIPINE BESYLATE 10 MG PO TABS: 10.0000 mg | ORAL_TABLET | Freq: Every day | ORAL | 1 refills | Status: DC

## 2023-11-09 MED ORDER — METOPROLOL SUCCINATE ER 200 MG PO TB24: 200.0000 mg | ORAL_TABLET | Freq: Every day | ORAL | 1 refills | Status: DC

## 2023-11-09 MED ORDER — HYDROCHLOROTHIAZIDE 25 MG PO TABS: 25.0000 mg | ORAL_TABLET | Freq: Every day | ORAL | 1 refills | Status: DC

## 2023-11-09 MED ORDER — ATORVASTATIN CALCIUM 80 MG PO TABS: 80.0000 mg | ORAL_TABLET | Freq: Every day | ORAL | 1 refills | Status: DC

## 2023-11-09 MED ORDER — POTASSIUM CHLORIDE CRYS ER 20 MEQ PO TBCR: 20.0000 meq | EXTENDED_RELEASE_TABLET | Freq: Two times a day (BID) | ORAL | 1 refills | Status: DC

## 2023-11-09 NOTE — Telephone Encounter (Signed)
 Copied from CRM [id number]. Topic: Clinical - Medication Refill >> Nov 09, 2023  2:39 PM Priscille Kluver wrote: Most Recent Primary Care Visit:  Provider: Laurey Arrow  Department: [facility]  Visit Type: MEDICARE AWV, SEQUENTIAL  Date: 10/05/2023  Medication: Amber from [employer] gives verbal approval for Hydroxyzine 10mg , she advised she will fax it over to [phone] this afternoon, she can be reached at [phone] option 5 Provider line for more questions.  Has the patient contacted their pharmacy? Yes (Agent: If no, request that the patient contact the pharmacy for the refill. If patient does not wish to contact the pharmacy document the reason why and proceed with request.) (Agent: If yes, when and what did the pharmacy advise?)  Is this the correct pharmacy for this prescription? Yes If no, delete pharmacy and type the correct one.  This is the patient's preferred pharmacy:  [id number] [place] [place], [place] - [address] [place] [address] Phone: [phone] Fax: [phone]   Has the prescription been filled recently? Yes  Is the patient out of the medication? Yes  Has the patient been seen for an appointment in the last year OR does the patient have an upcoming appointment? Yes  Can we respond through MyChart? No  Agent: Please be advised that Rx refills may take up to 3 business days. We ask that you follow-up with your pharmacy.

## 2023-11-09 NOTE — Patient Instructions (Addendum)
 Return in about 24 weeks (around 04/25/2024).        Great to see you today.  I have refilled the medication(s) we provide.   If labs were collected or images ordered, we will inform you of  results once we have received them and reviewed. We will contact you either by echart message, or telephone call.  Please give ample time to the testing facility, and our office to run,  receive and review results. Please do not call inquiring of results, even if you can see them in your chart. We will contact you as soon as we are able. If it has been over 1 week since the test was completed, and you have not yet heard from [place], then please call us.    - echart message- for normal results that have been seen by the patient already.   - telephone call: abnormal results or if patient has not viewed results in their echart.  If a referral to a specialist was entered for you, please call us in 2 weeks if you have not heard from the specialist office to schedule.

## 2023-11-09 NOTE — Telephone Encounter (Signed)
 Pharmacy Patient Advocate Encounter   Received notification from CoverMyMeds that prior authorization for Hydroxyzine 10 mg tablets is required/requested.   Insurance verification completed.   The patient is insured through [employer]  .   Per test claim: PA required; PA submitted to above mentioned insurance via CoverMyMeds Key/confirmation #/[id number] Status is pending

## 2023-11-09 NOTE — Progress Notes (Signed)
 Patient ID: Sara Moreno, female  DOB: 1945/10/14, 78 y.o.   MRN: [id number] Patient Care Team    Relationship Specialty Notifications Start End  Natalia Leatherwood, DO PCP - General Family Medicine  09/08/22   Bond, Doran Stabler, MD Referring Physician Ophthalmology  09/08/22   Bridgett Larsson, LCSW Social Worker Licensed Clinical Social Worker  01/17/23    Comment: [phone]    Chief Complaint  Patient presents with   Diabetes    Chronic Conditions/illness Management     Subjective: Sara Moreno is a 78 y.o.  female present for condition management appointment  All past medical history, surgical history, allergies, family history, immunizations, medications and social history were updated in the electronic medical record today. All recent labs, ED visits and hospitalizations within the last year were reviewed.  Type 2 diabetes mellitus with hyperlipidemia (HCC) Pt reports compliance with metformin 500 twice daily and atorvastatin 80 mg nightly.  Patient denies dizziness, hyperglycemic or hypoglycemic events. Patient denies numbness, tingling in the extremities or nonhealing wounds of feet.   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 Patient denies chest pain, shortness of breath, dizziness or lower extremity edema.    Pt takes a daily baby ASA. Pt is  prescribed statin.  Age-related osteoporosis without current pathological fracture Patient reports compliance with the use of Fosamax 70 mg once weekly for her osteoporosis.  Last DEXA 2021.      10/05/2023   10:42 AM 05/25/2023    9:56 AM 01/26/2023    1:15 PM 12/21/2022    2:08 PM 11/03/2022    3:18 PM  Depression screen PHQ 2/9  Decreased Interest 1 2 1  0 0  Down, Depressed, Hopeless  2 2 1  0 0  PHQ - 2 Score 3 4 2  0 0  Altered sleeping 0 2 0 0 0  Tired, decreased energy 0 1 0 0 0  Change in appetite 0 0 0 0 0  Feeling bad or failure about yourself   1 0 0 0  Trouble concentrating 0 0 0 0 0  Moving slowly or fidgety/restless 0 0 0 0 0  Suicidal thoughts  0 0 0 0  PHQ-9 Score 3 8 2  0 0  Difficult doing work/chores Somewhat difficult Somewhat difficult Not difficult at all        05/25/2023    9:57 AM 01/26/2023    1:15 PM 09/08/2022    1:27 PM  GAD 7 : Generalized Anxiety Score  Nervous, Anxious, on Edge 1 2 1   Control/stop worrying 0 2 2  Worry too much - different things 1 2 2   Trouble relaxing 1 0 0  Restless 0 0 0  Easily annoyed or irritable 1 0 0  Afraid - awful might happen 1 0 0  Total GAD 7 Score 5 6 5   Anxiety Difficulty Somewhat difficult Not difficult at all Not difficult at all          10/05/2023   10:38 AM 05/25/2023    9:56 AM 01/26/2023    1:15 PM 12/21/2022    2:08 PM 11/03/2022    3:17 PM  Fall Risk   Falls in the past year? 0 0 0 0 0  Number falls in past yr: 0 0 0 0   Injury with Fall? 0 0 0 0   Risk for fall due to :  No Fall Risks Impaired balance/gait;Impaired vision No Fall Risks   Follow up Falls evaluation completed;Education provided;Falls prevention discussed Falls evaluation completed Falls evaluation completed Falls evaluation completed      Immunization History  Administered Date(s) Administered   Fluad Quad(high Dose 65+) 05/30/2020, 05/02/2022   Influenza-Unspecified 05/03/2023   PFIZER(Purple Top)SARS-COV-2 Vaccination 11/08/2019, 11/29/2019, 05/27/2020   PNEUMOCOCCAL CONJUGATE-20 05/04/2022   RSV,unspecified 05/04/2022   Unspecified SARS-COV-2 Vaccination 05/03/2022   Zoster Recombinant(Shingrix) 06/11/2021, 07/13/2021    No results found.  Past Medical History:  Diagnosis Date   Arthritis    Blind    Chicken pox    Colon polyp    Diabetes mellitus without complication (HCC)    Glaucoma    Heart murmur     Hypercholesterolemia    Hypertension    Ingrowing toenail 12/02/2021   No Known Allergies  Past Surgical History:  Procedure Laterality Date   CATARACT EXTRACTION     CESAREAN SECTION  1971   COLONOSCOPY  2022   TONSILLECTOMY  1955   Family History  Problem Relation Age of Onset   Hypertension Mother    Arthritis Mother    Heart attack Mother    Heart disease Mother    Hypertension Father    Arthritis Father    Hypertension Sister    Arthritis Sister    Hearing loss Sister    Hypertension Son    Diabetes Son    Asthma Son    Depression Son    Social History   Social History Narrative   Marital status/children/pets: Widow.  Widow   Education/employment: Retired, high school education   Safety:      -smoke alarm in the home:Yes     - wears seatbelt: Yes     - Feels safe in their relationships: Yes       Allergies as of 11/09/2023   No Known Allergies      Medication List        Accurate as of November 09, 2023  1:27 PM. If you have any questions, ask your nurse or doctor.          alendronate 70 MG tablet Commonly known as: FOSAMAX Take 1 tablet (70 mg total) by mouth once a week. Empty stomach with a full glass of water   amLODipine 10 MG tablet Commonly known as: NORVASC Take 1 tablet (10 mg total) by mouth daily.   aspirin 81 MG chewable tablet Chew by mouth daily.   atorvastatin 80 MG tablet Commonly known as: LIPITOR Take 1 tablet (80 mg total) by mouth daily.   brimonidine 0.2 % ophthalmic solution Commonly known as: ALPHAGAN Apply to eye.   Dexcom G7 Receiver Hardie Pulley See admin instructions.   Dexcom G7 Sensor Misc Apply to skin every 10 days What changed: See the new instructions. Changed by: Felix Pacini   dorzolamide-timolol 2-0.5 % ophthalmic solution Commonly known as: COSOPT SMARTSIG:In Eye(s)   hydrochlorothiazide 25 MG tablet Commonly known as: HYDRODIURIL Take 1 tablet (25 mg total) by mouth daily.   hydrOXYzine 10 MG  tablet Commonly known as: ATARAX Take 1 tablet (10 mg total) by mouth at bedtime as needed. Started by: Felix Pacini   meclizine 25 MG tablet Commonly known as: ANTIVERT Take 1 tablet (25 mg total) by mouth 3 (three) times daily as needed for dizziness.  metFORMIN 500 MG tablet Commonly known as: GLUCOPHAGE Take 1 tablet (500 mg total) by mouth 2 (two) times daily with a meal.   metoprolol 200 MG 24 hr tablet Commonly known as: TOPROL-XL Take 1 tablet (200 mg total) by mouth daily.   potassium chloride SA 20 MEQ tablet Commonly known as: KLOR-CON M Take 1 tablet (20 mEq total) by mouth 2 (two) times daily.   prednisoLONE acetate 1 % ophthalmic suspension Commonly known as: PRED FORTE Place 1 drop into the right eye 3 (three) times daily.        All past medical history, surgical history, allergies, family history, immunizations andmedications were updated in the EMR today and reviewed under the history and medication portions of their EMR.      No results found.   ROS 14 pt review of systems performed and negative (unless mentioned in an HPI)  Objective: BP 136/79   Pulse 66   Ht 4\' 11"  (1.499 m)   Wt 128 lb (58.1 kg)   SpO2 97%   BMI 25.85 kg/m  Physical Exam Vitals and nursing note reviewed.  Constitutional:      General: She is not in acute distress.    Appearance: Normal appearance. She is not ill-appearing, toxic-appearing or diaphoretic.  HENT:     Head: Normocephalic and atraumatic.  Eyes:     General: No scleral icterus.       Right eye: No discharge.        Left eye: No discharge.     Extraocular Movements: Extraocular movements intact.     Conjunctiva/sclera: Conjunctivae normal.     Pupils: Pupils are equal, round, and reactive to light.  Cardiovascular:     Rate and Rhythm: Normal rate and regular rhythm.     Heart sounds: No murmur heard. Pulmonary:     Effort: Pulmonary effort is normal. No respiratory distress.     Breath sounds: Normal  breath sounds. No wheezing, rhonchi or rales.  Musculoskeletal:     Right lower leg: No edema.     Left lower leg: No edema.  Skin:    General: Skin is warm.     Findings: No rash.  Neurological:     Mental Status: She is alert and oriented to person, place, and time. Mental status is at baseline.     Motor: No weakness.     Gait: Gait normal.  Psychiatric:        Mood and Affect: Mood normal.        Behavior: Behavior normal.        Thought Content: Thought content normal.        Judgment: Judgment normal.     Diabetic Foot Exam - Simple   Simple Foot Form Diabetic Foot exam was performed with the following findings: Yes 11/09/2023  1:16 PM  Visual Inspection No deformities, no ulcerations, no other skin breakdown bilaterally: Yes Sensation Testing Intact to touch and monofilament testing bilaterally: Yes Pulse Check Posterior Tibialis and Dorsalis pulse intact bilaterally: Yes Comments    No results found for this or any previous visit (from the past 48 hours).   Assessment/plan: Sara Moreno is a 78 y.o. female present for chronic condition management  Type 2 diabetes mellitus with hyperlipidemia (HCC) Stable Continue metformin 500 mg twice daily PNA series: completed 2023 Flu shot: UTD 2024 (recommneded yearly) Foot exam: 11/09/2023-also established with podiatry Eye exam: completed 12/11/2022, Dr. Lottie Dawson Urine microalbumin collected/04/2024 A1c: 6.8 > 6.3> 6.6> collected  today  Primary hypertension Stable Continue amlodipine 10 mg daily Continue metoprolol succinate 200 mg daily Continue HCTZ 25 mg daily Continue K-Dur 20 daily Continue ASA 81 mg Continue atorvastatin 80 mg  Age-related osteoporosis without current pathological fracture/Hypervitaminosis D Patient reports she has been prescribed Fosamax 70 mg once weekly for her osteoporosis.  Last DEXA 2021. - DG Bone Density; Future> [facility]-order still active from 12/21/2022   Return in about 24 weeks (around  04/25/2024).  Orders Placed This Encounter  Procedures   Urine Microalbumin w/creat. ratio   CBC   Comp Met (CMET)   Hemoglobin A1c   TSH   Lipid panel   Meds ordered this encounter  Medications   amLODipine (NORVASC) 10 MG tablet    Sig: Take 1 tablet (10 mg total) by mouth daily.    Dispense:  90 tablet    Refill:  1   atorvastatin (LIPITOR) 80 MG tablet    Sig: Take 1 tablet (80 mg total) by mouth daily.    Dispense:  90 tablet    Refill:  1   hydrochlorothiazide (HYDRODIURIL) 25 MG tablet    Sig: Take 1 tablet (25 mg total) by mouth daily.    Dispense:  90 tablet    Refill:  1   metFORMIN (GLUCOPHAGE) 500 MG tablet    Sig: Take 1 tablet (500 mg total) by mouth 2 (two) times daily with a meal.    Dispense:  180 tablet    Refill:  1   metoprolol (TOPROL-XL) 200 MG 24 hr tablet    Sig: Take 1 tablet (200 mg total) by mouth daily.    Dispense:  90 tablet    Refill:  1   potassium chloride SA (KLOR-CON M) 20 MEQ tablet    Sig: Take 1 tablet (20 mEq total) by mouth 2 (two) times daily.    Dispense:  90 tablet    Refill:  1   hydrOXYzine (ATARAX) 10 MG tablet    Sig: Take 1 tablet (10 mg total) by mouth at bedtime as needed.    Dispense:  90 tablet    Refill:  1   Continuous Glucose Sensor (DEXCOM G7 SENSOR) MISC    Sig: Apply to skin every 10 days    Dispense:  3 each    Refill:  5   Referral Orders  No referral(s) requested today      Note is dictated utilizing voice recognition software. Although note has been proof read prior to signing, occasional typographical errors still can be missed. If any questions arise, please do not hesitate to call for verification.  Electronically signed by: Felix Pacini, DO [facility] [place]

## 2023-11-09 NOTE — Telephone Encounter (Signed)
 Pharmacy Patient Advocate Encounter  Received notification from [facility]  that Prior Authorization for hydrOXYzine HCl 10MG  tablets has been APPROVED from 11/09/2023 to 11/08/2024   PA #/Case ID/Reference #: [id number]

## 2023-11-09 NOTE — Telephone Encounter (Signed)
 Rx sent today.

## 2023-11-15 ENCOUNTER — Telehealth: Payer: Self-pay | Admitting: Family Medicine

## 2023-11-15 MED ORDER — LISINOPRIL 5 MG PO TABS: 5.0000 mg | ORAL_TABLET | Freq: Every day | ORAL | 3 refills | Status: DC

## 2023-11-15 NOTE — Telephone Encounter (Signed)
 Please call patient Her blood cell counts, thyroid function, liver and kidney function is stable and normal. A1c went up from 6.6 to 6.9.  This is still very well-controlled, would recommend she continue the metformin 1 tab twice a day.  If A1c rises above the 7.0 threshold next visit, then we would need to increase the medication regimen.  Would encourage her to avoid sugary drinks, snacks and desserts. Cholesterol panel is above goal with an LDL of 170.  Please make sure she is taking the Lipitor 80 mg every evening. Her urine protein levels are still mildly elevated.  We need to switch her blood pressure regimen to help give her kidney some protection.  Discontinue the hydrochlorothiazide/HCTZ and start the lisinopril 5 mg daily.  Lisinopril adds extra kidney protection and is indicated for patients with elevated protein in the urine.

## 2024-02-04 ENCOUNTER — Other Ambulatory Visit: Payer: Self-pay | Admitting: Family Medicine

## 2024-02-22 ENCOUNTER — Encounter: Payer: Self-pay | Admitting: Family Medicine

## 2024-02-22 ENCOUNTER — Ambulatory Visit (INDEPENDENT_AMBULATORY_CARE_PROVIDER_SITE_OTHER): Admitting: Family Medicine

## 2024-02-22 VITALS — BP 128/75 | HR 65 | Temp 98.3°F | Wt 125.4 lb

## 2024-02-22 DIAGNOSIS — R410 Disorientation, unspecified: Secondary | ICD-10-CM | POA: Diagnosis not present

## 2024-02-22 DIAGNOSIS — R42 Dizziness and giddiness: Secondary | ICD-10-CM

## 2024-02-22 DIAGNOSIS — H44512 Absolute glaucoma, left eye: Secondary | ICD-10-CM

## 2024-02-22 DIAGNOSIS — H401113 Primary open-angle glaucoma, right eye, severe stage: Secondary | ICD-10-CM

## 2024-02-22 DIAGNOSIS — H548 Legal blindness, as defined in USA: Secondary | ICD-10-CM | POA: Insufficient documentation

## 2024-02-22 DIAGNOSIS — N898 Other specified noninflammatory disorders of vagina: Secondary | ICD-10-CM

## 2024-02-22 DIAGNOSIS — L603 Nail dystrophy: Secondary | ICD-10-CM | POA: Insufficient documentation

## 2024-02-22 DIAGNOSIS — R350 Frequency of micturition: Secondary | ICD-10-CM

## 2024-02-22 LAB — POC URINALSYSI DIPSTICK (AUTOMATED)
Bilirubin, UA: NEGATIVE
Blood, UA: NEGATIVE
Glucose, UA: NEGATIVE
Ketones, UA: NEGATIVE
Leukocytes, UA: NEGATIVE
Nitrite, UA: NEGATIVE
Protein, UA: POSITIVE — AB
Spec Grav, UA: 1.015 (ref 1.010–1.025)
Urobilinogen, UA: 0.2 U/dL
pH, UA: 7 (ref 5.0–8.0)

## 2024-02-22 LAB — VITAMIN D 25 HYDROXY (VIT D DEFICIENCY, FRACTURES): VITD: 76.82 ng/mL (ref 30.00–100.00)

## 2024-02-22 LAB — TSH: TSH: 1.88 u[IU]/mL (ref 0.35–5.50)

## 2024-02-22 MED ORDER — FLUCONAZOLE 150 MG PO TABS: 150.0000 mg | ORAL_TABLET | Freq: Once | ORAL | 0 refills | Status: AC

## 2024-02-22 NOTE — Patient Instructions (Signed)

## 2024-02-22 NOTE — Progress Notes (Signed)
 Sara Moreno , 11-27-1945, 78 y.o., female MRN: [id number] Patient Care Team    Relationship Specialty Notifications Start End  Catherine Charlies LABOR, DO PCP - General Family Medicine  09/08/22   Bond, Reyes Mcardle, MD Referring Physician Ophthalmology  09/08/22   Ezzard Rolin BIRCH, LCSW Social Worker Licensed Clinical Social Worker  01/17/23    Comment: [phone]    Chief Complaint  Patient presents with   Vaginal Itching    1 month     Subjective: Sara Moreno is a 78 y.o. Pt presents for an OV with complaints of vaginal itching,  and confusion of 1 month duration.  Associated symptoms include urinary frequency, does not feel like she completely empties her bladder.  She denies fevers, chills, nausea or low back pain. She reports she has noticed a mild vaginal discharge and itching.  She has never had a yeast infection before. She also reports having a fogginess feeling.  She feels it is related to the brightness of her left eye being distracting not being able to focus anywhere else mentally.  Her son is with her today and states she seems to sometimes get confused on directions.  For example when he is walking her down the hall and will see okay trauma labs she sometimes will turn right or turn all the way around.  Patient seems to be aware of the mistake after it happens.  She also reports prolonged meals today.  She is wondering if her vitamin D  is low since she had stopped vitamin D  secondary to being overly replaced.      10/05/2023   10:42 AM 05/25/2023    9:56 AM 01/26/2023    1:15 PM 12/21/2022    2:08 PM 11/03/2022    3:18 PM  Depression screen PHQ 2/9  Decreased Interest 1 2 1  0 0  Down, Depressed, Hopeless 2 2 1  0 0  PHQ - 2 Score 3 4 2  0 0  Altered sleeping 0 2 0 0 0  Tired, decreased energy 0 1 0 0 0  Change in appetite 0 0 0 0 0  Feeling bad or failure about yourself   1 0 0 0  Trouble concentrating 0 0 0 0 0  Moving slowly or fidgety/restless 0 0 0 0 0  Suicidal  thoughts  0 0 0 0  PHQ-9 Score 3 8 2  0 0  Difficult doing work/chores Somewhat difficult Somewhat difficult Not difficult at all      No Known Allergies Social History   Social History Narrative   Marital status/children/pets: Widow.  Widow   Education/employment: Retired, high school education   Safety:      -smoke alarm in the home:Yes     - wears seatbelt: Yes     - Feels safe in their relationships: Yes      Past Medical History:  Diagnosis Date   Arthritis    Blind    Chicken pox    Colon polyp    Diabetes mellitus without complication (HCC)    Glaucoma    Heart murmur    Hypercholesterolemia    Hypertension    Ingrowing toenail 12/02/2021   Past Surgical History:  Procedure Laterality Date   CATARACT EXTRACTION     CESAREAN SECTION  1971   COLONOSCOPY  2022   TONSILLECTOMY  1955   Family History  Problem Relation Age of Onset   Hypertension Mother    Arthritis Mother  Heart attack Mother    Heart disease Mother    Hypertension Father    Arthritis Father    Hypertension Sister    Arthritis Sister    Hearing loss Sister    Hypertension Son    Diabetes Son    Asthma Son    Depression Son    Allergies as of 02/22/2024   No Known Allergies      Medication List        Accurate as of February 22, 2024 11:22 AM. If you have any questions, ask your nurse or doctor.          alendronate  70 MG tablet Commonly known as: FOSAMAX  Take 1 tablet (70 mg total) by mouth once a week. Empty stomach with a full glass of water   amLODipine  10 MG tablet Commonly known as: NORVASC  Take 1 tablet (10 mg total) by mouth daily.   aspirin 81 MG chewable tablet Chew by mouth daily.   atorvastatin  80 MG tablet Commonly known as: LIPITOR Take 1 tablet (80 mg total) by mouth daily.   brimonidine 0.2 % ophthalmic solution Commonly known as: ALPHAGAN Apply to eye.   Dexcom G7 Receiver Espiridion See admin instructions.   Dexcom G7 Sensor Misc Apply to skin every  10 days   dorzolamide-timolol 2-0.5 % ophthalmic solution Commonly known as: COSOPT SMARTSIG:In Eye(s)   fluconazole  150 MG tablet Commonly known as: DIFLUCAN  Take 1 tablet (150 mg total) by mouth once for 1 dose. Started by: Lynnet Hefley   hydrOXYzine  10 MG tablet Commonly known as: ATARAX  Take 1 tablet (10 mg total) by mouth at bedtime as needed.   lisinopril  5 MG tablet Commonly known as: ZESTRIL  Take 1 tablet (5 mg total) by mouth daily.   meclizine  25 MG tablet Commonly known as: ANTIVERT  Take 1 tablet (25 mg total) by mouth 3 (three) times daily as needed for dizziness.   metFORMIN  500 MG tablet Commonly known as: GLUCOPHAGE  Take 1 tablet (500 mg total) by mouth 2 (two) times daily with a meal.   metoprolol  200 MG 24 hr tablet Commonly known as: TOPROL -XL Take 1 tablet (200 mg total) by mouth daily.   potassium chloride  SA 20 MEQ tablet Commonly known as: KLOR-CON  M TAKE 1 TABLET BY MOUTH TWICE A DAY   prednisoLONE acetate 1 % ophthalmic suspension Commonly known as: PRED FORTE Place 1 drop into the right eye 3 (three) times daily.        All past medical history, surgical history, allergies, family history, immunizations andmedications were updated in the EMR today and reviewed under the history and medication portions of their EMR.     ROS Negative, with the exception of above mentioned in HPI   Objective:  BP 130/80   Pulse 65   Temp 98.3 F (36.8 C)   Wt 125 lb 6.4 oz (56.9 kg)   SpO2 97%   BMI 25.33 kg/m  Body mass index is 25.33 kg/m. Physical Exam Vitals and nursing note reviewed.  Constitutional:      General: She is not in acute distress.    Appearance: Normal appearance. She is normal weight. She is not ill-appearing or toxic-appearing.     Comments: Legally blind  HENT:     Head: Normocephalic and atraumatic.  Eyes:     General: No scleral icterus.       Right eye: No discharge.        Left eye: No discharge.     Extraocular  Movements:  Extraocular movements intact.     Conjunctiva/sclera: Conjunctivae normal.     Pupils: Pupils are equal, round, and reactive to light.  Cardiovascular:     Rate and Rhythm: Normal rate and regular rhythm.  Abdominal:     General: Abdomen is flat. Bowel sounds are normal. There is no distension.     Palpations: Abdomen is soft. There is no mass.     Tenderness: There is no abdominal tenderness. There is no right CVA tenderness, left CVA tenderness, guarding or rebound.  Skin:    Findings: No rash.     Comments: Thin nails bilateral hands  Neurological:     Mental Status: She is alert and oriented to person, place, and time. Mental status is at baseline.     Motor: No weakness.     Coordination: Coordination normal.     Gait: Gait normal.  Psychiatric:        Mood and Affect: Mood normal.        Behavior: Behavior normal.        Thought Content: Thought content normal.        Judgment: Judgment normal.      No results found. No results found. Results for orders placed or performed in visit on 02/22/24 (from the past 24 hours)  POCT Urinalysis Dipstick (Automated)     Status: Abnormal   Collection Time: 02/22/24 10:09 AM  Result Value Ref Range   Color, UA yellow    Clarity, UA clear    Glucose, UA Negative Negative   Bilirubin, UA neg    Ketones, UA neg    Spec Grav, UA 1.015 1.010 - 1.025   Blood, UA neg    pH, UA 7.0 5.0 - 8.0   Protein, UA Positive (A) Negative   Urobilinogen, UA 0.2 0.2 or 1.0 E.U./dL   Nitrite, UA neg    Leukocytes, UA Negative Negative    Assessment/Plan: Sara Moreno is a 78 y.o. female present for OV for  Vaginal itching/urinary frequency: - Diflucan  150 p.o. once Point-of-care urine urine does not appear infectious today. Sent for urine culture> pending Wait on urine culture-if appropriate will provide antibiotics. Consider overactive bladder med.  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  She does not feel her complaint was being addressed at her prior ophthalmology office.  She desires to know if there is anything that can be done to decrease the brightness she sees in her eye due to being legally blind.  She finds this very comfortable and is directable. - Ambulatory referral to Ophthalmology     Reviewed expectations re: course of current medical issues. Discussed self-management of symptoms. Outlined signs and symptoms indicating need for more acute intervention. Patient verbalized understanding and all questions were answered. Patient received an After-Visit Summary.    Orders Placed This Encounter  Procedures   Urinalysis w microscopic + reflex cultur   Vitamin D  (25 hydroxy)   TSH   Ambulatory referral to Ophthalmology   POCT Urinalysis Dipstick (Automated)   Meds ordered this encounter  Medications   fluconazole  (DIFLUCAN ) 150 MG tablet    Sig: Take 1 tablet (150 mg total) by mouth once for 1 dose.    Dispense:  1 tablet    Refill:  0   Referral Orders         Ambulatory referral to  Ophthalmology       Note is dictated utilizing voice recognition software. Although note has been proof read prior to signing, occasional typographical errors still can be missed. If any questions arise, please do not hesitate to call for verification.   electronically signed by:  Charlies Bellini, DO  [facility] [place]

## 2024-02-23 ENCOUNTER — Ambulatory Visit: Payer: Self-pay | Admitting: Family Medicine

## 2024-02-24 MED ORDER — NITROFURANTOIN MONOHYD MACRO 100 MG PO CAPS: 100.0000 mg | ORAL_CAPSULE | Freq: Two times a day (BID) | ORAL | 0 refills | Status: DC

## 2024-02-25 LAB — URINALYSIS W MICROSCOPIC + REFLEX CULTURE
Bacteria, UA: NONE SEEN /HPF
Bilirubin Urine: NEGATIVE
Glucose, UA: NEGATIVE
Hgb urine dipstick: NEGATIVE
Hyaline Cast: NONE SEEN /LPF
Ketones, ur: NEGATIVE
Nitrites, Initial: NEGATIVE
Protein, ur: NEGATIVE
RBC / HPF: NONE SEEN /HPF (ref 0–2)
Specific Gravity, Urine: 1.018 (ref 1.001–1.035)
Squamous Epithelial / HPF: NONE SEEN /HPF (ref <=5)
WBC, UA: NONE SEEN /HPF (ref 0–5)
pH: 7 (ref 5.0–8.0)

## 2024-02-25 LAB — URINE CULTURE
MICRO NUMBER:: 16737322
SPECIMEN QUALITY:: ADEQUATE

## 2024-02-25 LAB — CULTURE INDICATED

## 2024-02-27 MED ORDER — CIPROFLOXACIN HCL 500 MG PO TABS: 500.0000 mg | ORAL_TABLET | Freq: Two times a day (BID) | ORAL | 0 refills | Status: AC

## 2024-02-27 NOTE — Progress Notes (Signed)
 Called Pt and left VM to return my call. Results also sent in mychart.

## 2024-02-27 NOTE — Telephone Encounter (Signed)
 Please call patient Her urine culture did result with a UTI.  Unfortunately the antibiotic we prescribed will not work for this particular type of bacteria/UTI infection. I have called in a new antibiotic for her to start right away called Cipro .   It is 1 tab daily for 3 days.

## 2024-05-11 NOTE — Progress Notes (Signed)
 Sara Moreno                                          MRN: [id number]   05/11/2024   The [facility] Quality Team Specialist reviewed this patient medical record for the purposes of chart review for care gap closure. The following were reviewed: abstraction for care gap closure-kidney health evaluation for diabetes:eGFR  and uACR.    [facility] Quality Team

## 2024-06-18 ENCOUNTER — Other Ambulatory Visit: Payer: Self-pay

## 2024-06-18 MED ORDER — DEXCOM G7 SENSOR MISC: 5 refills | Status: AC

## 2024-07-13 ENCOUNTER — Telehealth: Payer: Self-pay | Admitting: Family Medicine

## 2024-07-13 ENCOUNTER — Other Ambulatory Visit: Payer: Self-pay

## 2024-07-13 MED ORDER — METOPROLOL SUCCINATE ER 200 MG PO TB24: 200.0000 mg | ORAL_TABLET | Freq: Every day | ORAL | 0 refills | Status: DC

## 2024-07-13 MED ORDER — AMLODIPINE BESYLATE 10 MG PO TABS: 10.0000 mg | ORAL_TABLET | Freq: Every day | ORAL | 0 refills | Status: DC

## 2024-07-13 NOTE — Telephone Encounter (Signed)
 Copied from CRM #[id number]. Topic: Clinical - Medication Refill >> Jul 13, 2024  9:33 AM Viola F wrote: Medication: metoprolol  (TOPROL -XL) 200 MG 24 hr tablet [[id number]] hydrochlorothiazide  (HYDRODIURIL ) 25 MG tablet [[id number]]  DISCONTINUED amLODipine  (NORVASC ) 10 MG tablet [[id number]]  Has the patient contacted their pharmacy? Yes (Agent: If no, request that the patient contact the pharmacy for the refill. If patient does not wish to contact the pharmacy document the reason why and proceed with request.) (Agent: If yes, when and what did the pharmacy advise?)  This is the patient's preferred pharmacy:   [id number] [address] [place], [place] - [address] [place]  [address] Phone: [phone] Fax: [phone]  Is this the correct pharmacy for this prescription? Yes If no, delete pharmacy and type the correct one.   Has the prescription been filled recently? Yes  Is the patient out of the medication? Yes  Has the patient been seen for an appointment in the last year OR does the patient have an upcoming appointment? Yes  Can we respond through MyChart? Yes  Agent: Please be advised that Rx refills may take up to 3 business days. We ask that you follow-up with your pharmacy.

## 2024-07-13 NOTE — Telephone Encounter (Signed)
 Rx sent.

## 2024-07-24 ENCOUNTER — Encounter (INDEPENDENT_AMBULATORY_CARE_PROVIDER_SITE_OTHER): Admitting: Family Medicine

## 2024-07-24 ENCOUNTER — Encounter: Payer: Self-pay | Admitting: Family Medicine

## 2024-07-24 DIAGNOSIS — Z91199 Patient's noncompliance with other medical treatment and regimen due to unspecified reason: Secondary | ICD-10-CM

## 2024-07-24 NOTE — Progress Notes (Signed)
" ° °  Same-day cancel for acute concern, overdue for condition management "

## 2024-08-04 ENCOUNTER — Other Ambulatory Visit: Payer: Self-pay | Admitting: Family Medicine

## 2024-08-23 ENCOUNTER — Encounter: Payer: Self-pay | Admitting: Family Medicine

## 2024-08-23 ENCOUNTER — Ambulatory Visit: Admitting: Family Medicine

## 2024-08-23 VITALS — BP 126/74 | HR 62 | Temp 98.1°F | Wt 126.0 lb

## 2024-08-23 DIAGNOSIS — Z7984 Long term (current) use of oral hypoglycemic drugs: Secondary | ICD-10-CM | POA: Diagnosis not present

## 2024-08-23 DIAGNOSIS — E785 Hyperlipidemia, unspecified: Secondary | ICD-10-CM | POA: Diagnosis not present

## 2024-08-23 DIAGNOSIS — L299 Pruritus, unspecified: Secondary | ICD-10-CM | POA: Diagnosis not present

## 2024-08-23 DIAGNOSIS — E1169 Type 2 diabetes mellitus with other specified complication: Secondary | ICD-10-CM | POA: Diagnosis not present

## 2024-08-23 DIAGNOSIS — R413 Other amnesia: Secondary | ICD-10-CM

## 2024-08-23 DIAGNOSIS — R2689 Other abnormalities of gait and mobility: Secondary | ICD-10-CM | POA: Diagnosis not present

## 2024-08-23 DIAGNOSIS — M810 Age-related osteoporosis without current pathological fracture: Secondary | ICD-10-CM

## 2024-08-23 DIAGNOSIS — I10 Essential (primary) hypertension: Secondary | ICD-10-CM | POA: Diagnosis not present

## 2024-08-23 DIAGNOSIS — R519 Headache, unspecified: Secondary | ICD-10-CM | POA: Diagnosis not present

## 2024-08-23 DIAGNOSIS — E11311 Type 2 diabetes mellitus with unspecified diabetic retinopathy with macular edema: Secondary | ICD-10-CM | POA: Diagnosis not present

## 2024-08-23 DIAGNOSIS — H9193 Unspecified hearing loss, bilateral: Secondary | ICD-10-CM | POA: Diagnosis not present

## 2024-08-23 DIAGNOSIS — H6122 Impacted cerumen, left ear: Secondary | ICD-10-CM | POA: Insufficient documentation

## 2024-08-23 LAB — LIPID PANEL
Cholesterol: 214 mg/dL — ABNORMAL HIGH (ref 28–200)
HDL: 61.8 mg/dL
LDL Cholesterol: 124 mg/dL — ABNORMAL HIGH (ref 10–99)
NonHDL: 152.46
Total CHOL/HDL Ratio: 3
Triglycerides: 144 mg/dL (ref 10.0–149.0)
VLDL: 28.8 mg/dL (ref 0.0–40.0)

## 2024-08-23 LAB — COMPREHENSIVE METABOLIC PANEL WITH GFR
ALT: 12 U/L (ref 3–35)
AST: 12 U/L (ref 5–37)
Albumin: 4.3 g/dL (ref 3.5–5.2)
Alkaline Phosphatase: 65 U/L (ref 39–117)
BUN: 21 mg/dL (ref 6–23)
CO2: 34 meq/L — ABNORMAL HIGH (ref 19–32)
Calcium: 10.4 mg/dL (ref 8.4–10.5)
Chloride: 97 meq/L (ref 96–112)
Creatinine, Ser: 0.8 mg/dL (ref 0.40–1.20)
GFR: 69.58 mL/min
Glucose, Bld: 134 mg/dL — ABNORMAL HIGH (ref 70–99)
Potassium: 3.3 meq/L — ABNORMAL LOW (ref 3.5–5.1)
Sodium: 141 meq/L (ref 135–145)
Total Bilirubin: 0.6 mg/dL (ref 0.2–1.2)
Total Protein: 7.1 g/dL (ref 6.0–8.3)

## 2024-08-23 LAB — CBC
HCT: 44 % (ref 36.0–46.0)
Hemoglobin: 14.6 g/dL (ref 12.0–15.0)
MCHC: 33.2 g/dL (ref 30.0–36.0)
MCV: 89.1 fl (ref 78.0–100.0)
Platelets: 230 K/uL (ref 150.0–400.0)
RBC: 4.94 Mil/uL (ref 3.87–5.11)
RDW: 14.7 % (ref 11.5–15.5)
WBC: 8.8 K/uL (ref 4.0–10.5)

## 2024-08-23 LAB — POCT GLYCOSYLATED HEMOGLOBIN (HGB A1C)
HbA1c POC (<> result, manual entry): 6.5 %
HbA1c, POC (controlled diabetic range): 6.5 % (ref 0.0–7.0)
HbA1c, POC (prediabetic range): 6.5 % — AB (ref 5.7–6.4)
Hemoglobin A1C: 6.5 % — AB (ref 4.0–5.6)

## 2024-08-23 MED ORDER — AMLODIPINE BESYLATE 10 MG PO TABS: 10.0000 mg | ORAL_TABLET | Freq: Every day | ORAL | 1 refills | Status: AC

## 2024-08-23 MED ORDER — ATORVASTATIN CALCIUM 80 MG PO TABS: 80.0000 mg | ORAL_TABLET | Freq: Every day | ORAL | 1 refills | Status: AC

## 2024-08-23 MED ORDER — HYDROXYZINE HCL 10 MG PO TABS: 10.0000 mg | ORAL_TABLET | Freq: Every evening | ORAL | 1 refills | Status: AC | PRN

## 2024-08-23 MED ORDER — LISINOPRIL 5 MG PO TABS: 5.0000 mg | ORAL_TABLET | Freq: Every day | ORAL | 1 refills | Status: AC

## 2024-08-23 MED ORDER — METOPROLOL SUCCINATE ER 200 MG PO TB24: 200.0000 mg | ORAL_TABLET | Freq: Every day | ORAL | 1 refills | Status: AC

## 2024-08-23 MED ORDER — METFORMIN HCL 500 MG PO TABS: 500.0000 mg | ORAL_TABLET | Freq: Two times a day (BID) | ORAL | 1 refills | Status: AC

## 2024-08-23 MED ORDER — POTASSIUM CHLORIDE CRYS ER 20 MEQ PO TBCR: 20.0000 meq | EXTENDED_RELEASE_TABLET | Freq: Two times a day (BID) | ORAL | 1 refills | Status: AC

## 2024-08-23 MED ORDER — DEBROX 6.5 % OT SOLN: 5.0000 [drp] | Freq: Every day | OTIC | 0 refills | Status: AC

## 2024-08-23 MED ORDER — ALENDRONATE SODIUM 70 MG PO TABS: 70.0000 mg | ORAL_TABLET | ORAL | 11 refills | Status: DC

## 2024-08-23 NOTE — Patient Instructions (Addendum)
 Return in about 15 weeks (around 12/06/2024) for Routine chronic condition follow-up.        Great to see you today.  I have refilled the medication(s) we provide.   If labs were collected or images ordered, we will inform you of  results once we have received them and reviewed. We will contact you either by echart message, or telephone call.  Please give ample time to the testing facility, and our office to run,  receive and review results. Please do not call inquiring of results, even if you can see them in your chart. We will contact you as soon as we are able. If it has been over 1 week since the test was completed, and you have not yet heard from us , then please call us .    - echart message- for normal results that have been seen by the patient already.   - telephone call: abnormal results or if patient has not viewed results in their echart.  If a referral to a specialist was entered for you, please call us  in 2 weeks if you have not heard from the specialist office to schedule.

## 2024-08-23 NOTE — Progress Notes (Signed)
 "    Patient ID: Sara Moreno, female  DOB: 1946-01-30, 79 y.o.   MRN: [id number] Patient Care Team    Relationship Specialty Notifications Start End  Catherine Charlies LABOR, DO PCP - General Family Medicine  09/08/22   Bond, Reyes Mcardle, MD Referring Physician Ophthalmology  09/08/22   Ezzard Rolin BIRCH, LCSW Social Worker Licensed Clinical Social Worker  01/17/23    Comment: [phone]    Chief Complaint  Patient presents with   Diabetes    Pt is fasting.  Influenza vaccine Eye exam    Subjective: Sara Moreno is a 79 y.o.  female present for condition management appointment  All past medical history, surgical history, allergies, family history, immunizations, medications and social history were updated in the electronic medical record today. All recent labs, ED visits and hospitalizations within the last year were reviewed.  Type 2 diabetes mellitus with hyperlipidemia (HCC) Pt reports compliance with metformin  500 twice daily and atorvastatin  80 mg nightly.  Patient denies dizziness, hyperglycemic or hypoglycemic events. Patient denies numbness, tingling in the extremities or nonhealing wounds of feet.   Pt reports BG ranges not routinely checked secondary to her not being able to see the readings well due to her blindness.we have ordered her a dexcom to assist   Primary hypertension Pt reports compliance with amlodipine  10 mg daily, metoprolol  200 mg daily, HCTZ 25 mg daily, K-Dur 20. Patient denies chest pain, shortness of breath, dizziness or lower extremity edema.   Pt takes a daily baby ASA. Pt is  prescribed statin.  Age-related osteoporosis without current pathological fracture Patient reports compliance with the use of Fosamax  35 mg once weekly for her osteoporosis.  Last DEXA 2021.   Patient has complaints/concerns today she would like to discuss.  She reports she has noticed she is a little more off balanced than usual.  She denies any falls.  She walks with a cane.  She  reports sometimes it feels like she is moving when she is not.  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  She has also noticed her hearing seems to be decreased which is also affecting her.  Her memory is declining, her son is with her today and states its mostly short-term memory.  She also has a hard time figuring out what she wants to say and finding words.  She endorses more frequent headaches centrally located in her forehead.  She states this is almost every day now.  She reports she will take a Tylenol on occasions to help with the headache, sometimes it helps.      08/23/2024    9:46 AM 10/05/2023   10:42 AM 05/25/2023    9:56 AM 01/26/2023    1:15 PM 12/21/2022    2:08 PM  Depression screen PHQ 2/9  Decreased Interest 0 1 2 1  0  Down, Depressed, Hopeless 0 2 2 1  0  PHQ - 2 Score 0 3 4 2  0  Altered sleeping 0 0 2 0 0  Tired, decreased energy 0 0 1 0 0  Change in appetite 0 0 0 0 0  Feeling bad or failure about yourself  0  1 0 0  Trouble concentrating 0 0 0 0 0  Moving slowly or fidgety/restless 0 0 0 0 0  Suicidal thoughts 0  0 0 0  PHQ-9 Score 0 3  8  2   0   Difficult doing work/chores Not difficult at all Somewhat difficult Somewhat difficult Not difficult  at all      Data saved with a previous flowsheet row definition      08/23/2024    9:46 AM 05/25/2023    9:57 AM 01/26/2023    1:15 PM 09/08/2022    1:27 PM  GAD 7 : Generalized Anxiety Score  Nervous, Anxious, on Edge 0 1  2  1    Control/stop worrying 0 0  2  2   Worry too much - different things 0 1  2  2    Trouble relaxing 0 1  0  0   Restless 0 0  0  0   Easily annoyed or irritable 0 1  0  0   Afraid - awful might happen 0 1  0  0   Total GAD 7 Score 0 5 6 5   Anxiety Difficulty Not difficult at all Somewhat difficult Not difficult at all Not difficult at all     Data saved with a previous flowsheet row definition          08/23/2024    9:46 AM 10/05/2023   10:38 AM 05/25/2023    9:56 AM 01/26/2023     1:15 PM 12/21/2022    2:08 PM  Fall Risk   Falls in the past year? 0 0 0 0 0  Number falls in past yr: 0 0 0 0 0  Injury with Fall? 0 0  0  0  0   Risk for fall due to : No Fall Risks  No Fall Risks Impaired balance/gait;Impaired vision No Fall Risks  Follow up Falls evaluation completed Falls evaluation completed;Education provided;Falls prevention discussed Falls evaluation completed Falls evaluation completed Falls evaluation completed     Data saved with a previous flowsheet row definition     Immunization History  Administered Date(s) Administered   Fluad Quad(high Dose 65+) 05/30/2020, 05/02/2022   Influenza-Unspecified 05/03/2023   Moderna Covid-19 Fall Seasonal Vaccine 86yrs & older 05/04/2022, 04/30/2023   PFIZER(Purple Top)SARS-COV-2 Vaccination 11/08/2019, 11/29/2019, 05/27/2020   PNEUMOCOCCAL CONJUGATE-20 05/04/2022   RSV,unspecified 05/04/2022   Respiratory Syncytial Virus Vaccine,Recomb Aduvanted(Arexvy) 05/04/2022   Unspecified SARS-COV-2 Vaccination 05/03/2022   Zoster Recombinant(Shingrix) 06/11/2021, 07/13/2021   Zoster, Live 07/13/2021    Hearing Screening   500Hz  1000Hz  2000Hz  3000Hz  4000Hz   Right ear 55 55 55 55 55  Left ear 55 55 55 55 55    Past Medical History:  Diagnosis Date   Arthritis    Blind    Chicken pox    Colon polyp    Diabetes mellitus without complication (HCC)    Glaucoma    Heart murmur    Hypercholesterolemia    Hypertension    Ingrowing toenail 12/02/2021   No Known Allergies  Past Surgical History:  Procedure Laterality Date   CATARACT EXTRACTION     CESAREAN SECTION  1971   COLONOSCOPY  2022   TONSILLECTOMY  1955   Family History  Problem Relation Age of Onset   Hypertension Mother    Arthritis Mother    Heart attack Mother    Heart disease Mother    Hypertension Father    Arthritis Father    Hypertension Sister    Arthritis Sister    Hearing loss Sister    Hypertension Son    Diabetes Son    Asthma Son     Depression Son    Social History   Social History Narrative   Marital status/children/pets: Widow.  Widow   Education/employment: Retired, high school education  Safety:      -smoke alarm in the home:Yes     - wears seatbelt: Yes     - Feels safe in their relationships: Yes       Allergies as of 08/23/2024   No Known Allergies      Medication List        Accurate as of August 23, 2024 11:44 AM. If you have any questions, ask your nurse or doctor.          STOP taking these medications    alendronate  70 MG tablet Commonly known as: FOSAMAX  Stopped by: Charlies Bellini, DO   meclizine  25 MG tablet Commonly known as: ANTIVERT  Stopped by: Charlies Bellini, DO       TAKE these medications    amLODipine  10 MG tablet Commonly known as: NORVASC  Take 1 tablet (10 mg total) by mouth daily.   aspirin 81 MG chewable tablet Chew by mouth daily.   atorvastatin  80 MG tablet Commonly known as: LIPITOR Take 1 tablet (80 mg total) by mouth daily.   brimonidine 0.2 % ophthalmic solution Commonly known as: ALPHAGAN Apply to eye.   Debrox 6.5 % OTIC solution Generic drug: carbamide peroxide Place 5 drops into the left ear daily for 7 days. Started by: Charlies Bellini, DO   Dexcom G7 Receiver Espiridion See admin instructions.   Dexcom G7 Sensor Misc Apply to skin every 10 days   dorzolamide-timolol 2-0.5 % ophthalmic solution Commonly known as: COSOPT SMARTSIG:In Eye(s)   hydrOXYzine  10 MG tablet Commonly known as: ATARAX  Take 1 tablet (10 mg total) by mouth at bedtime as needed.   lisinopril  5 MG tablet Commonly known as: ZESTRIL  Take 1 tablet (5 mg total) by mouth daily.   metFORMIN  500 MG tablet Commonly known as: GLUCOPHAGE  Take 1 tablet (500 mg total) by mouth 2 (two) times daily with a meal.   metoprolol  200 MG 24 hr tablet Commonly known as: TOPROL -XL Take 1 tablet (200 mg total) by mouth daily.   potassium chloride  SA 20 MEQ tablet Commonly known as:  KLOR-CON  M Take 1 tablet (20 mEq total) by mouth 2 (two) times daily.   prednisoLONE acetate 1 % ophthalmic suspension Commonly known as: PRED FORTE Place 1 drop into the right eye 3 (three) times daily.        All past medical history, surgical history, allergies, family history, immunizations andmedications were updated in the EMR today and reviewed under the history and medication portions of their EMR.      No results found.   Review of Systems  Constitutional: Negative.   HENT: Negative.    Eyes: Negative.   Respiratory: Negative.    Cardiovascular: Negative.   Gastrointestinal: Negative.   Genitourinary: Negative.   Musculoskeletal: Negative.   Skin: Negative.   Neurological: Negative.   Endo/Heme/Allergies: Negative.   Psychiatric/Behavioral: Negative.    All other systems reviewed and are negative.  14 pt review of systems performed and negative (unless mentioned in an HPI)  Objective: BP 126/74   Pulse 62   Temp 98.1 F (36.7 C)   Wt 126 lb (57.2 kg)   SpO2 99%   BMI 25.45 kg/m  Physical Exam Vitals and nursing note reviewed.  Constitutional:      General: She is not in acute distress.    Appearance: Normal appearance. She is not ill-appearing, toxic-appearing or diaphoretic.     Comments: Legally blind  HENT:     Head: Normocephalic and atraumatic.  Right Ear: Tympanic membrane, ear canal and external ear normal.     Left Ear: There is impacted cerumen.  Eyes:     General: No scleral icterus.       Right eye: No discharge.        Left eye: No discharge.     Extraocular Movements: Extraocular movements intact.     Conjunctiva/sclera: Conjunctivae normal.     Pupils: Pupils are equal, round, and reactive to light.  Cardiovascular:     Rate and Rhythm: Normal rate and regular rhythm.     Heart sounds: No murmur heard. Pulmonary:     Effort: Pulmonary effort is normal. No respiratory distress.     Breath sounds: Normal breath sounds. No  wheezing, rhonchi or rales.  Musculoskeletal:     Right lower leg: No edema.     Left lower leg: No edema.  Skin:    General: Skin is warm.     Findings: No rash.  Neurological:     Mental Status: She is alert and oriented to person, place, and time. Mental status is at baseline.     Motor: No weakness.     Gait: Gait normal.  Psychiatric:        Mood and Affect: Mood normal.        Behavior: Behavior normal.        Thought Content: Thought content normal.        Judgment: Judgment normal.     Diabetic Foot Exam - Simple   Simple Foot Form Diabetic Foot exam was performed with the following findings: Yes 08/23/2024  9:57 AM  Visual Inspection No deformities, no ulcerations, no other skin breakdown bilaterally: Yes Sensation Testing Intact to touch and monofilament testing bilaterally: Yes Pulse Check Posterior Tibialis and Dorsalis pulse intact bilaterally: Yes Comments    Results for orders placed or performed in visit on 08/23/24 (from the past 48 hours)  POCT glycosylated hemoglobin (Hb A1C)     Status: Abnormal   Collection Time: 08/23/24  9:59 AM  Result Value Ref Range   Hemoglobin A1C 6.5 (A) 4.0 - 5.6 %   HbA1c POC (<> result, manual entry) 6.5 4.0 - 5.6 %   HbA1c, POC (prediabetic range) 6.5 (A) 5.7 - 6.4 %   HbA1c, POC (controlled diabetic range) 6.5 0.0 - 7.0 %   Hearing Screening   500Hz  1000Hz  2000Hz  3000Hz  4000Hz   Right ear 55 55 55 55 55  Left ear 55 55 55 55 55     Assessment/plan: Sara Moreno is a 79 y.o. 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  Gen[place]- no recent due to blindness_ but will schedule at the same place her son goes.  Urine microalbumin collected today A1c: 6.8 > 6.3> 6.6>6.9> 6.5 collected  today - Ambulatory referral to Ophthalmology  Primary hypertension Stable Continue amlodipine  10 mg daily Continue metoprolol  succinate 200 mg daily Continue lisinopril  5 mg daily Continue HCTZ 25 mg daily Continue K-Dur 20 daily Continue ASA 81 mg Continue atorvastatin  80 mg  Pruritus Stable Continue vistaril  as needed  Age-related osteoporosis without current pathological fracture/Hypervitaminosis D Discontinued  Fosamax  70 mg once weekly for her osteoporosis.  >  on > 5 yrs and she forgets to take it.  Last DEXA 2021. - DG Bone Density; Future> [facility]-order still active from 12/21/2022  Influenza vaccine needed Declined  Decreased hearing of both ears - Ambulatory referral to Audiology -Significant hearing loss bilaterally-see hearing screen per above  Cerumen debris on tympanic membrane of left ear Debrox solution 5 drops daily with warm water lavage for 7 days left ear. Return to clinic in 1 week for recheck. We discussed cerumen needs to be completely removed before her formal hearing screen at audiology. Of note, there was no cerumen in the right ear canal and hearing was equally diminished  Memory changes/balance problems/daily headache Possibly multifactorial close, cannot rule out brain lesion, mass effect, ICH excetra.  Will obtain MRI and consider physical therapy referral if patient agreeable. - MR Brain W Wo Contrast; Future    Return in about 15 weeks (around 12/06/2024) for Routine chronic condition follow-up.  Orders Placed This Encounter  Procedures   MR Brain W Wo Contrast   Comp Met (CMET)   Urine Microalbumin w/creat. ratio   Lipid panel   CBC   Ambulatory referral to Ophthalmology   Ambulatory referral to Audiology   POCT glycosylated hemoglobin (Hb A1C)   Meds ordered this encounter   Medications   DISCONTD: alendronate  (FOSAMAX ) 70 MG tablet    Sig: Take 1 tablet (70 mg total) by mouth once a week. Empty stomach with a full glass of water    Dispense:  4 tablet    Refill:  11   amLODipine  (NORVASC ) 10 MG tablet    Sig: Take 1 tablet (10 mg total) by mouth daily.    Dispense:  90 tablet    Refill:  1   atorvastatin  (LIPITOR) 80 MG tablet    Sig: Take 1 tablet (80 mg total) by mouth daily.    Dispense:  90 tablet    Refill:  1   hydrOXYzine  (ATARAX ) 10 MG tablet    Sig: Take 1 tablet (10 mg total) by mouth at bedtime as needed.    Dispense:  90 tablet    Refill:  1   lisinopril  (ZESTRIL ) 5 MG tablet    Sig: Take 1 tablet (5 mg total) by mouth daily.    Dispense:  90 tablet    Refill:  1   metFORMIN  (GLUCOPHAGE ) 500 MG tablet    Sig: Take 1 tablet (500 mg total) by mouth 2 (two) times daily with a meal.    Dispense:  180 tablet    Refill:  1   metoprolol  (TOPROL -XL) 200 MG 24 hr tablet    Sig: Take 1 tablet (200 mg total) by mouth daily.    Dispense:  90 tablet    Refill:  1   potassium chloride  SA (KLOR-CON  M) 20 MEQ tablet    Sig: Take 1 tablet (20 mEq total) by mouth 2 (two) times daily.    Dispense:  180 tablet    Refill:  1   carbamide peroxide (DEBROX) 6.5 % OTIC solution    Sig: Place 5 drops into the left ear daily for 7 days.    Dispense:  1.8 mL    Refill:  0   Referral Orders         Ambulatory referral to Ophthalmology         Ambulatory referral to Audiology        Note is dictated utilizing voice recognition software. Although note has been proof read prior to  signing, occasional typographical errors still can be missed. If any questions arise, please do not hesitate to call for verification.  Electronically signed by: Charlies Bellini, DO [facility] "

## 2024-08-24 ENCOUNTER — Ambulatory Visit: Payer: Self-pay | Admitting: Family Medicine

## 2024-08-29 ENCOUNTER — Ambulatory Visit: Payer: Self-pay

## 2024-08-29 DIAGNOSIS — E1169 Type 2 diabetes mellitus with other specified complication: Secondary | ICD-10-CM

## 2024-08-29 MED ORDER — FREESTYLE LIBRE 3 SENSOR MISC: 3 refills | Status: DC

## 2024-08-29 NOTE — Addendum Note (Signed)
 Addended by: GEORGEAN BEEN A on: 08/29/2024 04:50 PM   Modules accepted: Orders

## 2024-08-29 NOTE — Telephone Encounter (Signed)
 Okay to prescribe freestyle herlene

## 2024-08-29 NOTE — Telephone Encounter (Signed)
 FYI Only or Action Required?: Action required by provider: clinical question for provider and update on patient condition.  Patient was last seen in primary care on 02/22/2024 by Catherine Fuller A, DO.  Called Nurse Triage reporting Medication Problem.  Triage Disposition: Call PCP When Office is Open  Patient/caregiver understands and will follow disposition?: Yes    Copied from CRM #[id number]. Topic: Clinical - Prescription Issue >> Aug 29, 2024  3:34 PM Frederich PARAS wrote: Reason for CRM: pt calling in , she can check the sugar , the sensor she has been prescribed in past is no longer covered. Pt needs a prescription for  free style lebrie  sensor  pt callback#  [phone]   Reason for Disposition  [1] Caller requesting NON-URGENT health information AND [2] PCP's office is the best resource  Protocols used: Information Only Call - No Triage-A-AH

## 2024-08-30 ENCOUNTER — Telehealth: Payer: Self-pay

## 2024-08-30 NOTE — Telephone Encounter (Signed)
 Source  Sheela Levorn HERO (Patient)   Subject  Sara Moreno, Sara Moreno (Patient)   Topic  Clinical - Prescription Issue    Communication  Reason for CRM: pt calling in , she can check the sugar , the sensor she has been prescribed in past is no longer covered. Pt needs a prescription for  free style lebrie  sensor  pt callback#  [phone]   Patient Information  Patient Name Gender DOB SSN  Sara Moreno, Sara Moreno Female Aug 08, 1945 [id number]   Notes  Hayes Carlyon LABOR   08/30/2024  3:38 PM  Pt son vinie is calling again in regards to the freestyle libre sensor as insurance covers that one and pt has not been able to check her sugars. Please call son vinie when script is sent.      Pt was using Dexcom sensor 06/18/24 but new rx for Freestyle Echelon 3 sent to pharmacy yesterday.

## 2024-08-31 ENCOUNTER — Other Ambulatory Visit: Payer: Self-pay

## 2024-08-31 DIAGNOSIS — E1169 Type 2 diabetes mellitus with other specified complication: Secondary | ICD-10-CM

## 2024-08-31 MED ORDER — FREESTYLE LIBRE 3 SENSOR MISC: 3 refills | Status: AC

## 2024-08-31 NOTE — Telephone Encounter (Signed)
 Pt son made aware prescription has been sent.

## 2024-10-10 ENCOUNTER — Ambulatory Visit
